# Patient Record
Sex: Male | Born: 1950 | Race: White | Hispanic: No | Marital: Married | Smoking: Current every day smoker
Health system: Southern US, Community
[De-identification: ages and names within clinical notes are randomized; demographics above are authoritative.]

## PROBLEM LIST (undated history)

## (undated) DIAGNOSIS — Z5189 Encounter for other specified aftercare: Secondary | ICD-10-CM

## (undated) DIAGNOSIS — H269 Unspecified cataract: Secondary | ICD-10-CM

## (undated) DIAGNOSIS — R011 Cardiac murmur, unspecified: Secondary | ICD-10-CM

## (undated) DIAGNOSIS — E079 Disorder of thyroid, unspecified: Secondary | ICD-10-CM

## (undated) DIAGNOSIS — M1990 Unspecified osteoarthritis, unspecified site: Secondary | ICD-10-CM

## (undated) DIAGNOSIS — J449 Chronic obstructive pulmonary disease, unspecified: Secondary | ICD-10-CM

## (undated) DIAGNOSIS — D509 Iron deficiency anemia, unspecified: Secondary | ICD-10-CM

## (undated) DIAGNOSIS — E785 Hyperlipidemia, unspecified: Secondary | ICD-10-CM

## (undated) DIAGNOSIS — E039 Hypothyroidism, unspecified: Secondary | ICD-10-CM

## (undated) HISTORY — DX: Unspecified cataract: H26.9

## (undated) HISTORY — DX: Disorder of thyroid, unspecified: E07.9

## (undated) HISTORY — DX: Cardiac murmur, unspecified: R01.1

## (undated) HISTORY — DX: Hyperlipidemia, unspecified: E78.5

## (undated) HISTORY — DX: Iron deficiency anemia, unspecified: D50.9

## (undated) HISTORY — PX: HERNIA REPAIR: SHX51

## (undated) HISTORY — DX: Unspecified osteoarthritis, unspecified site: M19.90

## (undated) HISTORY — DX: Hypothyroidism, unspecified: E03.9

## (undated) HISTORY — DX: Chronic obstructive pulmonary disease, unspecified: J44.9

## (undated) HISTORY — DX: Encounter for other specified aftercare: Z51.89

## (undated) HISTORY — PX: COLONOSCOPY: SHX174

---

## 2003-08-26 DIAGNOSIS — D229 Melanocytic nevi, unspecified: Secondary | ICD-10-CM

## 2003-08-26 HISTORY — DX: Melanocytic nevi, unspecified: D22.9

## 2009-09-14 ENCOUNTER — Ambulatory Visit: Payer: Self-pay | Admitting: Internal Medicine

## 2009-09-24 ENCOUNTER — Encounter: Payer: Self-pay | Admitting: Internal Medicine

## 2009-09-24 ENCOUNTER — Ambulatory Visit: Payer: Self-pay | Admitting: Internal Medicine

## 2009-09-29 ENCOUNTER — Encounter: Payer: Self-pay | Admitting: Internal Medicine

## 2012-07-31 ENCOUNTER — Encounter: Payer: Self-pay | Admitting: Internal Medicine

## 2013-01-01 ENCOUNTER — Other Ambulatory Visit: Payer: Self-pay | Admitting: Endocrinology

## 2013-01-01 DIAGNOSIS — E0590 Thyrotoxicosis, unspecified without thyrotoxic crisis or storm: Secondary | ICD-10-CM

## 2013-01-17 ENCOUNTER — Encounter (HOSPITAL_COMMUNITY)
Admission: RE | Admit: 2013-01-17 | Discharge: 2013-01-17 | Disposition: A | Discharge status: Home / Self Care | Payer: BC Managed Care – PPO | Source: Ambulatory Visit | Attending: Endocrinology | Admitting: Endocrinology

## 2013-01-17 DIAGNOSIS — E0590 Thyrotoxicosis, unspecified without thyrotoxic crisis or storm: Secondary | ICD-10-CM

## 2013-01-18 ENCOUNTER — Encounter (HOSPITAL_COMMUNITY)
Admission: RE | Admit: 2013-01-18 | Discharge: 2013-01-18 | Disposition: A | Discharge status: Home / Self Care | Payer: BC Managed Care – PPO | Source: Ambulatory Visit | Attending: Endocrinology | Admitting: Endocrinology

## 2013-01-18 MED ORDER — SODIUM PERTECHNETATE TC 99M INJECTION: 10.8000 | Freq: Once | INTRAVENOUS | Status: AC | PRN

## 2013-01-18 MED ORDER — SODIUM IODIDE I 131 CAPSULE: 9.6000 | Freq: Once | INTRAVENOUS | Status: AC | PRN

## 2013-03-27 ENCOUNTER — Ambulatory Visit
Admission: RE | Admit: 2013-03-27 | Discharge: 2013-03-27 | Disposition: A | Discharge status: Home / Self Care | Payer: BC Managed Care – PPO | Source: Ambulatory Visit | Attending: Family Medicine | Admitting: Family Medicine

## 2013-03-27 ENCOUNTER — Other Ambulatory Visit: Payer: Self-pay | Admitting: Family Medicine

## 2013-03-27 DIAGNOSIS — R599 Enlarged lymph nodes, unspecified: Secondary | ICD-10-CM

## 2013-03-28 ENCOUNTER — Other Ambulatory Visit: Payer: Self-pay | Admitting: Family Medicine

## 2013-03-28 ENCOUNTER — Telehealth: Payer: Self-pay | Admitting: Radiology

## 2013-03-28 DIAGNOSIS — E041 Nontoxic single thyroid nodule: Secondary | ICD-10-CM

## 2013-03-28 NOTE — Telephone Encounter (Signed)
ERROR.  INCORRECT CHART  Above message entered in incorrect chart  Amado Nash, RN

## 2013-03-28 NOTE — Telephone Encounter (Deleted)
Left message on daughter's (POA) mobile phone.  Need to schedule 2 yr f/u Surveillance of Left Renal Mass w/ Dr Fredia Sorrow.

## 2013-04-02 ENCOUNTER — Other Ambulatory Visit (HOSPITAL_COMMUNITY)
Admission: RE | Admit: 2013-04-02 | Discharge: 2013-04-02 | Disposition: A | Discharge status: Home / Self Care | Payer: BC Managed Care – PPO | Source: Ambulatory Visit | Attending: Interventional Radiology | Admitting: Interventional Radiology

## 2013-04-02 ENCOUNTER — Ambulatory Visit
Admission: RE | Admit: 2013-04-02 | Discharge: 2013-04-02 | Disposition: A | Discharge status: Home / Self Care | Payer: BC Managed Care – PPO | Source: Ambulatory Visit | Attending: Family Medicine | Admitting: Family Medicine

## 2013-04-02 DIAGNOSIS — E041 Nontoxic single thyroid nodule: Secondary | ICD-10-CM

## 2013-04-02 DIAGNOSIS — E049 Nontoxic goiter, unspecified: Secondary | ICD-10-CM | POA: Insufficient documentation

## 2013-07-23 ENCOUNTER — Other Ambulatory Visit: Payer: Self-pay | Admitting: *Deleted

## 2013-07-23 ENCOUNTER — Other Ambulatory Visit (INDEPENDENT_AMBULATORY_CARE_PROVIDER_SITE_OTHER): Payer: BC Managed Care – PPO

## 2013-07-23 DIAGNOSIS — E039 Hypothyroidism, unspecified: Secondary | ICD-10-CM

## 2013-07-23 DIAGNOSIS — E0590 Thyrotoxicosis, unspecified without thyrotoxic crisis or storm: Secondary | ICD-10-CM | POA: Insufficient documentation

## 2013-07-23 LAB — COMPREHENSIVE METABOLIC PANEL
ALT: 14 U/L (ref 0–53)
BUN: 17 mg/dL (ref 6–23)
CO2: 28 mEq/L (ref 19–32)
Calcium: 9.3 mg/dL (ref 8.4–10.5)
Chloride: 102 mEq/L (ref 96–112)
Creatinine, Ser: 1 mg/dL (ref 0.4–1.5)
GFR: 79.23 mL/min (ref >60.00)

## 2013-07-23 LAB — T4, FREE: Free T4: 0.62 ng/dL (ref 0.60–1.60)

## 2013-07-25 ENCOUNTER — Ambulatory Visit (INDEPENDENT_AMBULATORY_CARE_PROVIDER_SITE_OTHER): Payer: BC Managed Care – PPO | Admitting: Endocrinology

## 2013-07-25 ENCOUNTER — Encounter: Payer: Self-pay | Admitting: Endocrinology

## 2013-07-25 VITALS — BP 118/80 | HR 60 | Temp 98.5°F | Resp 12 | Ht 70.0 in | Wt 175.0 lb

## 2013-07-25 DIAGNOSIS — E0590 Thyrotoxicosis, unspecified without thyrotoxic crisis or storm: Secondary | ICD-10-CM

## 2013-07-25 NOTE — Progress Notes (Signed)
Patient ID: Terry Miranda, male   DOB: 08/14/1951, 62 y.o.   MRN: [id number]  Reason for Appointment:  Hyperthyroidism, new visit    History of Present Illness:   The Hyperthyroidism was first diagnosed in 11/2012 Initial symptoms at diagnosis was shakiness, some fatigue and feeling warm. Also had lost 20 pounds  Treatment was started in 1/14 with 5 mg methimazole twice a day. He had good results with this and the medication was tapered off and stopped in 5/14. 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 Do not feel unusually cold or lethargic         Appointment on 07/23/2013  Component Date Value Range Status  . TSH 07/23/2013 0.91  0.35 - 5.50 uIU/mL Final  . Free T4 07/23/2013 0.62  0.60 - 1.60 ng/dL Final  . Sodium 40/98/1191 138  135 - 145 mEq/L Final  . Potassium 07/23/2013 4.2  3.5 - 5.1 mEq/L Final  . Chloride 07/23/2013 102  96 - 112 mEq/L Final  . CO2 07/23/2013 28  19 - 32 mEq/L Final  . Glucose, Bld 07/23/2013 80  70 - 99 mg/dL Final  . BUN 47/82/9562 17  6 - 23 mg/dL Final  . Creatinine, Ser 07/23/2013 1.0  0.4 - 1.5 mg/dL Final  . Total Bilirubin 07/23/2013 0.5  0.3 - 1.2 mg/dL Final  . Alkaline Phosphatase 07/23/2013 87  39 - 117 U/L Final  . AST 07/23/2013 16  0 - 37 U/L Final  . ALT 07/23/2013 14  0 - 53 U/L Final  . Total Protein 07/23/2013 7.3  6.0 - 8.3 g/dL Final  . Albumin 13/07/6577 3.8  3.5 - 5.2 g/dL Final  . Calcium 46/96/2952 9.3  8.4 - 10.5 mg/dL Final  . GFR 84/13/2440 79.23  >60.00 mL/min Final      Medication List       This list is accurate as of: 07/25/13  3:09 PM.  Always use your most recent med list.               cholecalciferol 1000 UNITS tablet   Commonly known as:  VITAMIN D  Take 2,000 Units by mouth daily. Two tablets once a day     methimazole 5 MG tablet  Commonly known as:  TAPAZOLE  5 mg daily.     tamsulosin 0.4 MG Caps capsule  Commonly known as:  FLOMAX  0.4 mg.            No past medical history on file.  No past surgical history on file.  No family history on file.  Social History:  reports that he has been smoking Cigarettes.  He has been smoking about 1.00 pack per day. He has never used smokeless tobacco. His alcohol and drug histories are not on file.  Allergies:  Allergies  Allergen Reactions  . Amoxicillin     REACTION: face swelled    Review of Systems:  CARDIOLOGY: no history of high blood pressure.       GASTROENTEROLOGY:  no  nausea  ENDOCRINOLOGY:  no history of Diabetes.    Has history of BPH  Has previous history of hypercholesterolemia  Examination:   BP 118/80  Pulse 60  Temp(Src) 98.5 F (36.9 C)  Resp 12  Ht 5\' 10"  (1.778 m)  Wt 175 lb (79.379 kg)  BMI 25.11 kg/m2  SpO2 96%   General Appearance: pleasant, not anxious or hyperkinetic..          Eyes: No proptosis, eyes look normal externally.          Neck: The thyroid is enlarged 1-1/2 times normal, smooth, non-tender and mostly on the right side.       Extremities: hands are normal to touch, not diaphoretic Neurological: REFLEXES: at biceps are normal.         TREMORS:  none    Assessments 1. Hyperthyroidism - 242.90  He has had mild hyperthyroidism treated with antithyroid drugs. He did have recurrence when his medication was stopped in  5/14 Hyperthyroidism has improved again with low-dose methimazole, was having only minimal symptoms on his last visit in June No shakiness at this time He is tolerating the methimazole well and has had good response His free T4 is low normal and TSH is back to normal indicating incipient hypothyroidism   Treatment:  Reduce the dose to half a tablet and probably continue  this for least 2 months, he will call if he is having any recurrence of hypothyroid symptoms or unusual fatigue    Terry Miranda 07/25/2013, 3:09 PM

## 2013-07-25 NOTE — Patient Instructions (Addendum)
HALF TAB DAILY

## 2013-07-31 ENCOUNTER — Other Ambulatory Visit: Payer: Self-pay | Admitting: *Deleted

## 2013-07-31 MED ORDER — METHIMAZOLE 5 MG PO TABS: 5.0000 mg | ORAL_TABLET | Freq: Every day | ORAL | Status: DC

## 2013-08-08 ENCOUNTER — Telehealth: Payer: Self-pay | Admitting: Endocrinology

## 2013-08-08 MED ORDER — METHIMAZOLE 5 MG PO TABS: 5.0000 mg | ORAL_TABLET | Freq: Every day | ORAL | Status: DC

## 2013-08-08 NOTE — Telephone Encounter (Signed)
Med - Thyroid med. Normal pharmacy will not refill, must use [facility] (3 month supply) / Sherri S.

## 2013-08-08 NOTE — Telephone Encounter (Signed)
rx sent to [facility]

## 2013-09-20 ENCOUNTER — Other Ambulatory Visit (INDEPENDENT_AMBULATORY_CARE_PROVIDER_SITE_OTHER): Payer: BC Managed Care – PPO

## 2013-09-20 DIAGNOSIS — E0590 Thyrotoxicosis, unspecified without thyrotoxic crisis or storm: Secondary | ICD-10-CM

## 2013-09-23 ENCOUNTER — Other Ambulatory Visit: Payer: BC Managed Care – PPO

## 2013-09-25 ENCOUNTER — Ambulatory Visit (INDEPENDENT_AMBULATORY_CARE_PROVIDER_SITE_OTHER): Payer: BC Managed Care – PPO | Admitting: Endocrinology

## 2013-09-25 ENCOUNTER — Encounter: Payer: Self-pay | Admitting: Endocrinology

## 2013-09-25 VITALS — BP 126/80 | HR 57 | Temp 98.3°F | Resp 12 | Ht 70.0 in | Wt 178.4 lb

## 2013-09-25 DIAGNOSIS — E0590 Thyrotoxicosis, unspecified without thyrotoxic crisis or storm: Secondary | ICD-10-CM

## 2013-09-25 NOTE — Patient Instructions (Signed)
1/2 tab three times a week for 4 weeks ( 11/15)  Then stop till next visit

## 2013-09-25 NOTE — Progress Notes (Signed)
Patient ID: Terry Miranda, male   DOB: 11-01-1951, 62 y.o.   MRN: [id number]  Reason for Appointment:  Hyperthyroidism, followup visit    History of Present Illness:    The Hyperthyroidism was first diagnosed in 11/2012 Initial symptoms at diagnosis were shakiness, some fatigue and feeling warm. Also had lost 20 pounds  Treatment was started in 12/2012 with 5 mg methimazole twice a day. He had good results with this and the medication was tapered off and stopped in 5/14. 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       TSH in August was 0.91 and free T4 was 0.62  Appointment on 09/20/2013  Component Date Value Range Status  . Free T4 09/20/2013 0.67  0.60 - 1.60 ng/dL Final  . TSH 40/98/1191 0.54  0.35 - 5.50 uIU/mL Final      Medication List       This list is accurate as of: 09/25/13  4:52 PM.  Always use your most recent med list.               cholecalciferol 1000 UNITS tablet  Commonly known as:  VITAMIN D  Take 2,000 Units by mouth daily. Two tablets once a day     methimazole 5 MG tablet  Commonly known as:  TAPAZOLE  Take 5 mg by mouth daily. 1/2 tablet daily     tamsulosin 0.4 MG Caps capsule  Commonly known as:  FLOMAX  0.4 mg.            No past medical history on file.  No past surgical history on file.  No family history on file.  Social History:  reports that he has been smoking Cigarettes.  He has been smoking about 1.00 pack per day. He has never used smokeless tobacco. His alcohol and drug histories are not on file.  Allergies:  Allergies  Allergen Reactions  . Amoxicillin     REACTION: face  swelled    Review of Systems:  CARDIOLOGY: no history of high blood pressure.       ENDOCRINOLOGY:  no history of Diabetes.    Has history of BPH  Has previous history of hypercholesterolemia           Examination:   BP 126/80  Pulse 57  Temp(Src) 98.3 F (36.8 C)  Resp 12  Ht 5\' 10"  (1.778 m)  Wt 178 lb 6.4 oz (80.922 kg)  BMI 25.6 kg/m2  SpO2 97%   General Appearance: pleasant, not anxious or hyperkinetic..          Eyes: No proptosis, eyes look normal externally.          Neck: The thyroid is enlarged 1-1/2 times normal, smooth, not nodular and mostly on the right side.       Extremities: hands are normal to touch Neurological: REFLEXES: at biceps are normal.         TREMORS:  none    Assessments 1. Hyperthyroidism - 242.90  He has had mild hyperthyroidism treated with antithyroid drugs. He did have recurrence when his medication was stopped in  5/14 Hyperthyroidism is  stable again with low-dose methimazole, subjectively having no symptoms Still has a small goiter He is only on low-dose methimazole with 2.5 mg His free T4 is relatively low normal and TSH is still normal indicating probable remission   Treatment:  Reduce the dose to half a tablet 3 times a week for the next 4 weeks and then stop He will call if any symptoms of hyperthyroidism recurr but otherwise followup in 2 months   Liban Guedes 09/25/2013, 4:52 PM

## 2013-12-02 ENCOUNTER — Other Ambulatory Visit (INDEPENDENT_AMBULATORY_CARE_PROVIDER_SITE_OTHER): Payer: BC Managed Care – PPO

## 2013-12-02 DIAGNOSIS — E0590 Thyrotoxicosis, unspecified without thyrotoxic crisis or storm: Secondary | ICD-10-CM

## 2013-12-02 LAB — TSH: TSH: 0.02 u[IU]/mL — ABNORMAL LOW (ref 0.35–5.50)

## 2013-12-03 ENCOUNTER — Ambulatory Visit: Payer: BC Managed Care – PPO

## 2013-12-03 DIAGNOSIS — E039 Hypothyroidism, unspecified: Secondary | ICD-10-CM

## 2013-12-04 ENCOUNTER — Ambulatory Visit (INDEPENDENT_AMBULATORY_CARE_PROVIDER_SITE_OTHER): Payer: BC Managed Care – PPO | Admitting: Endocrinology

## 2013-12-04 ENCOUNTER — Encounter: Payer: Self-pay | Admitting: Endocrinology

## 2013-12-04 VITALS — BP 118/70 | HR 56 | Temp 98.3°F | Resp 12 | Ht 70.0 in | Wt 181.3 lb

## 2013-12-04 DIAGNOSIS — E0590 Thyrotoxicosis, unspecified without thyrotoxic crisis or storm: Secondary | ICD-10-CM

## 2013-12-04 NOTE — Progress Notes (Signed)
Patient ID: Terry Miranda, male   DOB: 08-Dec-1951, 62 y.o.   MRN: [id number]  Reason for Appointment:  Hyperthyroidism, followup visit    History of Present Illness:   Hyperthyroidism was first diagnosed in 11/2012 Initial symptoms at diagnosis were shakiness, some fatigue and feeling warm. Also had lost 20 pounds  Treatment was started in 12/2012 with 5 mg methimazole twice a day. He had good results with this and the medication was tapered off and stopped in 5/14. 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 Also having mild fatigue but no cord intolerance or palpitations He  has gained a little weight since his last visit.       LABS:  Clinical Support on 12/03/2013  Component Date Value Range Status  . T3, Free 12/03/2013 4.4* 2.3 - 4.2 pg/mL Final  Appointment on 12/02/2013  Component Date Value Range Status  . TSH 12/02/2013 0.02* 0.35 - 5.50 uIU/mL Final  . Free T4 12/02/2013 1.17  0.60 - 1.60 ng/dL Final   Lab Results  Component Value Date   FREET4 1.17 12/02/2013   FREET4 0.67 09/20/2013   FREET4 0.62 07/23/2013   TSH 0.02* 12/02/2013   TSH 0.54 09/20/2013   TSH 0.91 07/23/2013      Medication List       This list is accurate as of: 12/04/13  8:25 AM.  Always use your most recent med list.               cholecalciferol 1000 UNITS tablet  Commonly known as:  VITAMIN D  Take 2,000 Units by mouth daily. Two tablets once a day     methimazole 5 MG tablet  Commonly known as:  TAPAZOLE  Take 5 mg by mouth daily. 1/2 tablet daily     tamsulosin 0.4 MG Caps capsule  Commonly  known as:  FLOMAX  0.4 mg.            No past medical history on file.  No past surgical history on file.  No family history on file.  Social History:  reports that he has been smoking Cigarettes.  He has been smoking about 1.00 pack per day. He has never used smokeless tobacco. His alcohol and drug histories are not on file.  Allergies:  Allergies  Allergen Reactions  . Amoxicillin     REACTION: face swelled    Review of Systems:  CARDIOLOGY: no history of high blood pressure.       He has no history of Diabetes.    Has history of BPH  Has previous history of hypercholesterolemia           Examination:   BP 118/70  Pulse 56  Temp(Src) 98.3 F (36.8 C)  Resp 12  Ht 5\' 10"  (1.778 m)  Wt 181 lb 4.8 oz (82.237 kg)  BMI 26.01 kg/m2  SpO2 98%   General Appearance: pleasant, not anxious          Eyes: No proptosis, minimal puffiness of eyelids  Neck: The thyroid is enlarged 1-1/2-2 times normal, smooth, not nodular on the right side.       Extremities:  skin is normal Neurological: REFLEXES: at biceps are normal.   TREMORS:  none    Assessment:. 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  He again appears to have had a relapse after his medication was stopped in 11/14 even though it was tapered off very slowly and his free T4 was low normal on the last visit Currently he has a suppressed TSH and a high free T3 level  Still has a  right-sided goiterWhich appears to be relatively larger  Discussed needing I-131 since he has relapsed even with one year duration of treatment with antithyroid drugs He is still reluctant to consider this and wants to have more information on I-131. This was provided He looked this over and call in one month with his decision    Treatment:    For now will restart methimazole 5 mg daily then discuss treatment options in about a month     Sharlyne Koeneman 12/04/2013, 8:25 AM

## 2014-01-14 ENCOUNTER — Other Ambulatory Visit: Payer: BC Managed Care – PPO

## 2014-01-14 ENCOUNTER — Other Ambulatory Visit: Payer: Self-pay | Admitting: Endocrinology

## 2014-01-16 ENCOUNTER — Other Ambulatory Visit (INDEPENDENT_AMBULATORY_CARE_PROVIDER_SITE_OTHER): Payer: BC Managed Care – PPO

## 2014-01-16 DIAGNOSIS — E0590 Thyrotoxicosis, unspecified without thyrotoxic crisis or storm: Secondary | ICD-10-CM

## 2014-01-16 LAB — T4, FREE: Free T4: 0.68 ng/dL (ref 0.60–1.60)

## 2014-01-16 LAB — T3, FREE: T3 FREE: 3.1 pg/mL (ref 2.3–4.2)

## 2014-01-16 LAB — TSH: TSH: 0.77 u[IU]/mL (ref 0.35–5.50)

## 2014-01-22 ENCOUNTER — Encounter: Payer: Self-pay | Admitting: Endocrinology

## 2014-01-22 ENCOUNTER — Ambulatory Visit (INDEPENDENT_AMBULATORY_CARE_PROVIDER_SITE_OTHER): Payer: BC Managed Care – PPO | Admitting: Endocrinology

## 2014-01-22 VITALS — BP 110/68 | HR 73 | Temp 97.9°F | Resp 14 | Ht 70.0 in | Wt 180.4 lb

## 2014-01-22 DIAGNOSIS — E0590 Thyrotoxicosis, unspecified without thyrotoxic crisis or storm: Secondary | ICD-10-CM

## 2014-01-22 NOTE — Patient Instructions (Signed)
Monday before test stop Methimazole  Now take 1/2 daily

## 2014-01-22 NOTE — Progress Notes (Signed)
Patient ID: Terry Miranda, male   DOB: 1951/02/28, 63 y.o.   MRN: [id number]    Reason for Appointment:  Hyperthyroidism, followup visit   History of Present Illness:   Hyperthyroidism was first diagnosed in 11/2012 Initial symptoms at diagnosis were shakiness, some fatigue and feeling warm. Also had lost 20 pounds  Treatment was started in 12/2012 with 5 mg methimazole twice a day. He had good results with this and the medication was tapered off and stopped in 5/14. 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 Subsequently with stopping his medication in 11/14 he relapsed with his hyperthyroidism and was restarted on methimazole in 12/14  Since his last visit his mild symptoms of shakiness are better and has no unusual heat intolerance but may feel a little cold at times He is taking is 5 mg methimazole quite regularly       Wt Readings from Last 3 Encounters:  01/22/14 180 lb 6.4 oz (81.829 kg)  12/04/13 181 lb 4.8 oz (82.237 kg)  09/25/13 178 lb 6.4 oz (80.922 kg)   LABS:  Appointment on 01/16/2014  Component Date Value Ref Range Status  . TSH 01/16/2014 0.77  0.35 - 5.50 uIU/mL Final  . Free T4 01/16/2014 0.68  0.60 - 1.60 ng/dL Final  . T3, Free 01/16/2014 3.1  2.3 - 4.2 pg/mL Final   Lab Results  Component Value Date   FREET4 0.68 01/16/2014   FREET4 1.17 12/02/2013   FREET4 0.67 09/20/2013   FREET4 0.62 07/23/2013   TSH 0.77 01/16/2014   TSH 0.02* 12/02/2013   TSH 0.54 09/20/2013   TSH 0.91 07/23/2013      Medication List       This list is accurate as of: 01/22/14  4:10 PM.  Always use your most recent med list.               cholecalciferol 1000 UNITS tablet  Commonly known as:  VITAMIN D   Take 2,000 Units by mouth daily. Two tablets once a day     methimazole 5 MG tablet  Commonly known as:  TAPAZOLE  Take 5 mg by mouth daily.     methimazole 5 MG tablet  Commonly known as:  TAPAZOLE  TAKE 1 TABLET DAILY     tamsulosin 0.4 MG Caps capsule  Commonly known as:  FLOMAX  0.4 mg.            No past medical history on file.  No past surgical history on file.  No family history on file.  Social History:  reports that he has been smoking Cigarettes.  He has been smoking about 1.00 pack per day. He has never used smokeless tobacco. His alcohol and drug histories are not on file.  Allergies:  Allergies  Allergen Reactions  . Amoxicillin     REACTION: face swelled    Review of Systems:  CARDIOLOGY: no history of high blood pressure.       He has no history of Diabetes.    Has history of BPH  Has previous history of hypercholesterolemia           Examination:   BP 110/68  Pulse 73  Temp(Src) 97.9 F (36.6 C)  Resp 14  Ht 5\' 10"  (1.778 m)  Wt 180 lb 6.4 oz (81.829 kg)  BMI 25.88 kg/m2  SpO2 97%   General Appearance: pleasant, not anxious          Eyes: No proptosis, minimal puffiness of eyelids        Neck: The thyroid is enlarged 1-1/2-2 times normal, smooth, mostly on the right side.       Extremities:  skin is normal Neurological: REFLEXES: at biceps are normal.   TREMORS:  none    Assessment/Plan:. 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 He has not been able to get into remission with long-term treatment, even though he was requiring low doses of methimazole  Currently is well-controlled with 5 mg methimazole  Still has a  right-sided goiter  Discussed that I-131 would be the preferred treatment to achieve control and he agrees to do this now He wants to do this next month when he is on vacation Meanwhile he will take 2.5 mg  methimazole   Stephane Niemann 01/22/2014, 4:10 PM

## 2014-02-10 ENCOUNTER — Ambulatory Visit (HOSPITAL_COMMUNITY): Payer: BC Managed Care – PPO

## 2014-02-11 ENCOUNTER — Encounter (HOSPITAL_COMMUNITY): Payer: BC Managed Care – PPO

## 2014-02-24 ENCOUNTER — Ambulatory Visit (HOSPITAL_COMMUNITY): Payer: BC Managed Care – PPO

## 2014-02-24 ENCOUNTER — Encounter (HOSPITAL_COMMUNITY)
Admission: RE | Admit: 2014-02-24 | Discharge: 2014-02-24 | Disposition: A | Discharge status: Home / Self Care | Payer: BC Managed Care – PPO | Source: Ambulatory Visit | Attending: Endocrinology | Admitting: Endocrinology

## 2014-02-24 DIAGNOSIS — E0590 Thyrotoxicosis, unspecified without thyrotoxic crisis or storm: Secondary | ICD-10-CM | POA: Insufficient documentation

## 2014-02-25 ENCOUNTER — Encounter (HOSPITAL_COMMUNITY)
Admission: RE | Admit: 2014-02-25 | Discharge: 2014-02-25 | Disposition: A | Discharge status: Home / Self Care | Payer: BC Managed Care – PPO | Source: Ambulatory Visit | Attending: Endocrinology | Admitting: Endocrinology

## 2014-02-25 ENCOUNTER — Encounter (HOSPITAL_COMMUNITY): Payer: BC Managed Care – PPO

## 2014-02-25 ENCOUNTER — Telehealth: Payer: Self-pay | Admitting: Endocrinology

## 2014-02-25 MED ORDER — SODIUM IODIDE I 131 CAPSULE
11.0000 | Freq: Once | INTRAVENOUS | Status: AC | PRN
  Administered 2014-02-25: 11 via ORAL

## 2014-02-25 NOTE — Telephone Encounter (Signed)
Pt would like results asap of scan and if possible have treatment this week

## 2014-02-26 ENCOUNTER — Other Ambulatory Visit: Payer: Self-pay | Admitting: Endocrinology

## 2014-02-26 DIAGNOSIS — E0590 Thyrotoxicosis, unspecified without thyrotoxic crisis or storm: Secondary | ICD-10-CM

## 2014-02-26 NOTE — Telephone Encounter (Signed)
Please see below and advise.

## 2014-02-26 NOTE — Telephone Encounter (Signed)
Please see the result note, referral done

## 2014-02-26 NOTE — Telephone Encounter (Signed)
I spoke with patient, gave him his results.

## 2014-02-26 NOTE — Progress Notes (Signed)
Quick Note:  He will be scheduled for the radioactive iodine treatment, his uptake test is mildly increased as expected, to stop methimazole completely from now on, should have followup in 4-6 weeks with labs ______

## 2014-03-20 ENCOUNTER — Ambulatory Visit (HOSPITAL_COMMUNITY): Payer: BC Managed Care – PPO

## 2014-03-20 ENCOUNTER — Encounter (HOSPITAL_COMMUNITY)
Admission: RE | Admit: 2014-03-20 | Discharge: 2014-03-20 | Disposition: A | Discharge status: Home / Self Care | Payer: BC Managed Care – PPO | Source: Ambulatory Visit | Attending: Endocrinology | Admitting: Endocrinology

## 2014-03-20 DIAGNOSIS — E0590 Thyrotoxicosis, unspecified without thyrotoxic crisis or storm: Secondary | ICD-10-CM | POA: Insufficient documentation

## 2014-03-20 MED ORDER — SODIUM IODIDE I 131 CAPSULE: 15.9000 | Freq: Once | INTRAVENOUS | Status: AC | PRN

## 2014-03-21 ENCOUNTER — Other Ambulatory Visit: Payer: BC Managed Care – PPO

## 2014-03-26 ENCOUNTER — Ambulatory Visit: Payer: BC Managed Care – PPO | Admitting: Endocrinology

## 2014-04-09 ENCOUNTER — Other Ambulatory Visit (INDEPENDENT_AMBULATORY_CARE_PROVIDER_SITE_OTHER): Payer: BC Managed Care – PPO

## 2014-04-09 DIAGNOSIS — E0590 Thyrotoxicosis, unspecified without thyrotoxic crisis or storm: Secondary | ICD-10-CM

## 2014-04-09 LAB — T4, FREE: Free T4: 0.55 ng/dL — ABNORMAL LOW (ref 0.60–1.60)

## 2014-04-09 LAB — TSH: TSH: 1.76 u[IU]/mL (ref 0.35–5.50)

## 2014-04-14 ENCOUNTER — Ambulatory Visit: Payer: BC Managed Care – PPO | Admitting: Endocrinology

## 2014-04-24 ENCOUNTER — Ambulatory Visit (INDEPENDENT_AMBULATORY_CARE_PROVIDER_SITE_OTHER): Payer: BC Managed Care – PPO | Admitting: Endocrinology

## 2014-04-24 ENCOUNTER — Encounter: Payer: Self-pay | Admitting: Endocrinology

## 2014-04-24 VITALS — BP 132/70 | HR 51 | Temp 98.0°F | Resp 14 | Ht 70.0 in | Wt 185.8 lb

## 2014-04-24 DIAGNOSIS — E890 Postprocedural hypothyroidism: Secondary | ICD-10-CM

## 2014-04-24 MED ORDER — LEVOTHYROXINE SODIUM 112 MCG PO TABS: 112.0000 ug | ORAL_TABLET | Freq: Every day | ORAL | Status: DC

## 2014-04-24 NOTE — Progress Notes (Signed)
Patient ID: Terry Miranda, male   DOB: Aug 05, 1951, 63 y.o.   MRN: [id number]    Reason for Appointment:  Hyperthyroidism, followup visit   History of Present Illness:   Hyperthyroidism was first diagnosed in 11/2012 Initial symptoms at diagnosis were shakiness, some fatigue and feeling warm. Also had lost 20 pounds  Treatment in 12/2012 was with 5 mg methimazole twice a day. He had good results with this and the medication was tapered off and stopped in 5/14. 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 Subsequently with stopping his medication in 11/14 he relapsed with his hyperthyroidism and was restarted on methimazole in 12/14 Since he was able to wean off his methimazole again he was sent for I-131 treatment He had this on 03/20/14 with 15.9 mCi and methimazole was stopped a week before this Since his last visit he has noticed some cold sensitivity but no unusual fatigue. Has gained some weight Also asking about some soreness in the neck area on swallowing which gradually started before his I-131 treatment       Wt Readings from Last 3 Encounters:  04/24/14 185 lb 12.8 oz (84.278 kg)  01/22/14 180 lb 6.4 oz (81.829 kg)  12/04/13 181 lb 4.8 oz (82.237 kg)   LABS:  Lab Results  Component Value Date   FREET4 0.55* 04/09/2014   FREET4 0.68 01/16/2014   FREET4 1.17 12/02/2013   FREET4 0.67 09/20/2013   TSH 1.76 04/09/2014   TSH 0.77 01/16/2014   TSH 0.02* 12/02/2013   TSH 0.54 09/20/2013      Medication List       This list is accurate as of: 04/24/14  8:09 AM.  Always use your most recent med list.               cholecalciferol 1000 UNITS tablet  Commonly known as:  VITAMIN D  Take 2,000 Units by mouth  daily. Two tablets once a day     methimazole 5 MG tablet  Commonly known as:  TAPAZOLE  Take 5 mg by mouth daily.     methimazole 5 MG tablet  Commonly known as:  TAPAZOLE  TAKE 1 TABLET DAILY     tamsulosin 0.4 MG Caps capsule  Commonly known as:  FLOMAX  0.4 mg.            No past medical history on file.  No past surgical history on file.  No family history on file.  Social History:  reports that he has been smoking Cigarettes.  He has been smoking about 1.00 pack per day. He has never used smokeless tobacco. His alcohol and drug histories are not on file.  Allergies:  Allergies  Allergen Reactions  . Amoxicillin     REACTION: face swelled    Review of Systems:  CARDIOLOGY: no history of high blood pressure.       He has no history of Diabetes.    Has history of BPH  Has previous history of hypercholesterolemia           Examination:   BP 132/70  Pulse 51  Temp(Src) 98 F (36.7 C)  Resp 14  Ht 5\' 10"  (1.778 m)  Wt 185 lb 12.8 oz (84.278 kg)  BMI 26.66 kg/m2  SpO2 98%   General Appearance:  looks well        Eyes: No proptosis, minimal puffiness of eyelids        Neck: The thyroid is enlarged 1-1/2 times normal, smooth, only on the right side, nontender.       Extremities:  skin is normal Neurological: REFLEXES: at biceps are normal.       Assessment/Plan:. 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

## 2014-06-04 ENCOUNTER — Other Ambulatory Visit (INDEPENDENT_AMBULATORY_CARE_PROVIDER_SITE_OTHER): Payer: BC Managed Care – PPO

## 2014-06-04 DIAGNOSIS — E890 Postprocedural hypothyroidism: Secondary | ICD-10-CM

## 2014-06-04 LAB — TSH: TSH: 0.02 u[IU]/mL — ABNORMAL LOW (ref 0.35–4.50)

## 2014-06-04 LAB — T4, FREE: FREE T4: 1.25 ng/dL (ref 0.60–1.60)

## 2014-06-12 ENCOUNTER — Other Ambulatory Visit: Payer: BC Managed Care – PPO

## 2014-06-18 ENCOUNTER — Ambulatory Visit (INDEPENDENT_AMBULATORY_CARE_PROVIDER_SITE_OTHER): Payer: BC Managed Care – PPO | Admitting: Endocrinology

## 2014-06-18 ENCOUNTER — Encounter: Payer: Self-pay | Admitting: Endocrinology

## 2014-06-18 VITALS — BP 110/63 | HR 49 | Temp 98.0°F | Resp 14 | Ht 70.0 in | Wt 187.4 lb

## 2014-06-18 DIAGNOSIS — I498 Other specified cardiac arrhythmias: Secondary | ICD-10-CM

## 2014-06-18 DIAGNOSIS — E890 Postprocedural hypothyroidism: Secondary | ICD-10-CM

## 2014-06-18 DIAGNOSIS — R001 Bradycardia, unspecified: Secondary | ICD-10-CM

## 2014-06-18 MED ORDER — LEVOTHYROXINE SODIUM 88 MCG PO TABS: 88.0000 ug | ORAL_TABLET | Freq: Every day | ORAL | Status: DC

## 2014-06-18 NOTE — Progress Notes (Signed)
Patient ID: Terry Miranda, male   DOB: May 20, 1951, 63 y.o.   MRN: [id number]    Reason for Appointment:  Hyperthyroidism, followup visit   History of Present Illness:   Hyperthyroidism was first diagnosed in 11/2012 Initial symptoms at diagnosis were shakiness, some fatigue and feeling warm. Also had lost 20 pounds  Treatment in 12/2012 was with 5 mg methimazole twice a day. He had good results with this and the medication was tapered off and stopped in 5/14. 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 Subsequently with stopping his medication in 11/14 he relapsed with his hyperthyroidism and was restarted on methimazole in 12/14 Since he was able to wean off his methimazole again he was given 15.9 mCi of I-131 on 03/20/14  Because of developing mild hypothyroidism he was started on Synthroid 112 mcg on 04/24/14 He has not had any cold intolerance or fatigue although has gained a couple pounds; no palpitations or shakiness       Wt Readings from Last 3 Encounters:  06/18/14 187 lb 6.4 oz (85.004 kg)  04/24/14 185 lb 12.8 oz (84.278 kg)  01/22/14 180 lb 6.4 oz (81.829 kg)   LABS:  Lab Results  Component Value Date   FREET4 1.25 06/04/2014   FREET4 0.55* 04/09/2014   FREET4 0.68 01/16/2014   FREET4 1.17 12/02/2013   TSH 0.02* 06/04/2014   TSH 1.76 04/09/2014   TSH 0.77 01/16/2014   TSH 0.02* 12/02/2013      Medication List       This list is accurate as of: 06/18/14  4:14 PM.  Always use your most recent med list.               cholecalciferol 1000 UNITS tablet  Commonly known as:  VITAMIN D  Take 2,000 Units by mouth daily. Two tablets once a day     levothyroxine 112 MCG tablet  Commonly known as:   SYNTHROID, LEVOTHROID  Take 1 tablet (112 mcg total) by mouth daily.     tamsulosin 0.4 MG Caps capsule  Commonly known as:  FLOMAX  0.4 mg.            No past medical history on file.  No past surgical history on file.  No family history on file.  Social History:  reports that he has been smoking Cigarettes.  He has been smoking about 1.00 pack per day. He has never used smokeless tobacco. His alcohol and drug histories are not on file.  Allergies:  Allergies  Allergen Reactions  . Amoxicillin     REACTION: face swelled    Review of Systems:  CARDIOLOGY: no history of high blood pressure.  No history of lightheadedness, no previous history of heart murmur      He has no history of Diabetes.    Has history of BPH  Has previous history of hypercholesterolemia   No history of edema         Examination:   BP 110/63  Pulse 49  Temp(Src) 98 F (36.7 C)  Resp 14  Ht 5\' 10"  (1.778 m)  Wt 187 lb 6.4 oz (85.004 kg)  BMI 26.89 kg/m2  SpO2 98%  General Appearance:  looks well        Eyes: No proptosis, minimal puffiness of eyelids        Neck: The thyroid is just palpable on the right, smooth Heart rhythm is regular, has 2/6 ejection murmur at the base Extremities:  skin is normal No edema Neurological: REFLEXES: at biceps are normal.  no tremor.       Assessment/Plan:  Post ablative hypothyroidism:  He was started on levothyroxine 112 mcg about 6 weeks ago and although he subjectively doing well his TSH is now low again without much increase in free T4 No signs or symptoms of hyperthyroidism clinically and his right thyroid lobe is only minimally palpable  Will reduce his dose to 88 mcg and have him return in 8 weeks for followup labs.  Sinus bradycardia:  His pulse is again relatively low even though his hypothyroidism has resolved Currently asymptomatic EKG done today, this does not show any abnormal rhythm. Will forward this to  PCP  [facility] 06/18/2014, 4:14 PM

## 2014-08-08 ENCOUNTER — Encounter: Payer: Self-pay | Admitting: Internal Medicine

## 2014-08-14 ENCOUNTER — Other Ambulatory Visit (INDEPENDENT_AMBULATORY_CARE_PROVIDER_SITE_OTHER): Payer: BC Managed Care – PPO

## 2014-08-14 DIAGNOSIS — E890 Postprocedural hypothyroidism: Secondary | ICD-10-CM

## 2014-08-14 LAB — TSH: TSH: 0.4 u[IU]/mL (ref 0.35–4.50)

## 2014-08-14 LAB — T4, FREE: Free T4: 1.01 ng/dL (ref 0.60–1.60)

## 2014-08-29 ENCOUNTER — Encounter: Payer: Self-pay | Admitting: Endocrinology

## 2014-08-29 ENCOUNTER — Ambulatory Visit (INDEPENDENT_AMBULATORY_CARE_PROVIDER_SITE_OTHER): Payer: BC Managed Care – PPO | Admitting: Endocrinology

## 2014-08-29 VITALS — BP 127/66 | HR 55 | Temp 97.7°F | Resp 14 | Ht 70.0 in | Wt 188.8 lb

## 2014-08-29 DIAGNOSIS — E890 Postprocedural hypothyroidism: Secondary | ICD-10-CM

## 2014-08-29 NOTE — Patient Instructions (Signed)
1/2 pill once a week otherwise 1 daily

## 2014-08-29 NOTE — Progress Notes (Signed)
Patient ID: Terry Miranda, male   DOB: [birth year], 63 y.o.   MRN: [id number]    Reason for Appointment:  Post ablative hypothyroidism  followup visit   History of Present Illness:   Hyperthyroidism was first diagnosed in 11/2012 Initial symptoms at diagnosis were shakiness, some fatigue and feeling warm. Also had lost 20 pounds  Treatment in 12/2012 was with 5 mg methimazole twice a day. He had good results with this and the medication was tapered off and stopped in 5/14. Because of relapsing hyperthyroidism in 6/14 with minimal symptoms  he was started back on methimazole 5 mg daily Subsequently with stopping his medication in 11/14 he relapsed with his hyperthyroidism and was restarted on methimazole in 12/14 Since he was not able to wean off his methimazole again he was given 15.9 mCi of I-131 on 03/20/14  When he developed mild hypothyroidism he was started on Synthroid 112 mcg on 04/24/14 This dose was reduced in 6/15 He feels fairly well except is concerned about his weight being higher than before; his wife thinks that he is not watching his portions He has not had any fatigue       Wt Readings from Last 3 Encounters:  08/29/14 188 lb 12.8 oz (85.639 kg)  06/18/14 187 lb 6.4 oz (85.004 kg)  04/24/14 185 lb 12.8 oz (84.278 kg)   LABS:  Lab Results  Component Value Date   FREET4 1.01 08/14/2014   FREET4 1.25 06/04/2014   FREET4 0.55* 04/09/2014   FREET4 0.68 01/16/2014   TSH 0.40 08/14/2014   TSH 0.02* 06/04/2014   TSH 1.76 04/09/2014   TSH 0.77 01/16/2014      Medication List       This list is accurate as of: 08/29/14  4:24 PM.  Always use your most recent med list.               cholecalciferol 1000 UNITS tablet  Commonly known as:  VITAMIN D  Take 2,000 Units by mouth daily. Two tablets once a day     levothyroxine 88 MCG tablet  Commonly known as:  SYNTHROID, LEVOTHROID  Take 1 tablet (88 mcg total) by mouth daily.     tamsulosin 0.4 MG Caps capsule  Commonly known  as:  FLOMAX  0.4 mg.            No past medical history on file.  No past surgical history on file.  No family history on file.  Social History:  reports that he has been smoking Cigarettes.  He has been smoking about 1.00 pack per day. He has never used smokeless tobacco. His alcohol and drug histories are not on file.  Allergies:  Allergies  Allergen Reactions  . Amoxicillin     REACTION: face swelled    Review of Systems:  CARDIOLOGY: no history of high blood pressure. Has history of low pulse rate and cardiac murmur   He has no history of Diabetes.    Has history of BPH  Has previous history of hypercholesterolemia           Examination:   BP 127/66  Pulse 55  Temp(Src) 97.7 F (36.5 C)  Resp 14  Ht 5\' 10"  (1.778 m)  Wt 188 lb 12.8 oz (85.639 kg)  BMI 27.09 kg/m2  SpO2 97%   General Appearance:  looks well        Eyes: No proptosis, minimal puffiness of eyelids        Neck: The thyroid  is nonpalpable No edema Neurological: REFLEXES: at biceps are normal.  no tremor.       Assessment/Plan:  Post ablative hypothyroidism:  He is doing clinically well and his TSH is normal although only 0.4  Will reduce his dose to 6-1/2 pills per week of 88 mcg and have him return in 6 months for followup labs.    Madysen Faircloth 08/29/2014, 4:24 PM

## 2014-09-19 ENCOUNTER — Other Ambulatory Visit: Payer: Self-pay | Admitting: *Deleted

## 2014-09-19 ENCOUNTER — Telehealth: Payer: Self-pay | Admitting: *Deleted

## 2014-09-19 MED ORDER — LEVOTHYROXINE SODIUM 88 MCG PO TABS: ORAL_TABLET | ORAL | Status: DC

## 2014-09-19 NOTE — Telephone Encounter (Signed)
rx sent

## 2014-09-19 NOTE — Telephone Encounter (Signed)
Was under the impression he was going to have a RX faxed to his pharmacy for his thyroid he will be at work until 4pm then you have to try his cell #

## 2014-11-04 ENCOUNTER — Telehealth: Payer: Self-pay | Admitting: Endocrinology

## 2014-11-04 ENCOUNTER — Other Ambulatory Visit: Payer: Self-pay | Admitting: *Deleted

## 2014-11-04 MED ORDER — LEVOTHYROXINE SODIUM 88 MCG PO TABS: ORAL_TABLET | ORAL | Status: DC

## 2014-11-04 NOTE — Telephone Encounter (Signed)
rx sent

## 2014-11-04 NOTE — Telephone Encounter (Signed)
[facility]   Thank you

## 2014-11-04 NOTE — Telephone Encounter (Signed)
ok 

## 2015-01-23 ENCOUNTER — Encounter: Payer: Self-pay | Admitting: Internal Medicine

## 2015-02-09 ENCOUNTER — Ambulatory Visit (AMBULATORY_SURGERY_CENTER): Payer: Self-pay | Admitting: *Deleted

## 2015-02-09 VITALS — Ht 70.0 in | Wt 189.0 lb

## 2015-02-09 DIAGNOSIS — Z86010 Personal history of colonic polyps: Secondary | ICD-10-CM

## 2015-02-09 MED ORDER — MOVIPREP 100 G PO SOLR: ORAL | Status: DC

## 2015-02-09 NOTE — Progress Notes (Signed)
Patient denies any allergies to eggs or soy. Patient denies any problems with anesthesia/sedation. Patient denies any oxygen use at home and does not take any diet/weight loss medications. EMMI education assisgned to patient on colonoscopy, this was explained and instructions given to patient. 

## 2015-02-13 ENCOUNTER — Telehealth: Payer: Self-pay | Admitting: Internal Medicine

## 2015-02-13 NOTE — Telephone Encounter (Signed)
Standley Dakins RN called pt back, mobile number was full and unable to leave message, called work number and left message that she mailed him new instructions for miralax prep.-adm  Spoke with pt, told him we sent instructions for new prep and he could take the other back to the pharmacy, went over some instructions with him and what to buy. Pt verbalized understanding and will call if he has not received the new instructions by 02/18/15.-adm

## 2015-02-16 ENCOUNTER — Encounter: Payer: Self-pay | Admitting: Internal Medicine

## 2015-02-23 ENCOUNTER — Other Ambulatory Visit (INDEPENDENT_AMBULATORY_CARE_PROVIDER_SITE_OTHER): Payer: BLUE CROSS/BLUE SHIELD

## 2015-02-23 ENCOUNTER — Other Ambulatory Visit: Payer: BC Managed Care – PPO

## 2015-02-23 DIAGNOSIS — E890 Postprocedural hypothyroidism: Secondary | ICD-10-CM

## 2015-02-23 LAB — TSH: TSH: 3.02 u[IU]/mL (ref 0.35–4.50)

## 2015-02-23 LAB — T4, FREE: Free T4: 0.85 ng/dL (ref 0.60–1.60)

## 2015-02-24 ENCOUNTER — Encounter: Payer: Self-pay | Admitting: Internal Medicine

## 2015-02-24 ENCOUNTER — Ambulatory Visit (AMBULATORY_SURGERY_CENTER): Payer: BLUE CROSS/BLUE SHIELD | Admitting: Internal Medicine

## 2015-02-24 VITALS — BP 118/46 | HR 46 | Temp 98.4°F | Resp 22 | Ht 70.0 in | Wt 189.0 lb

## 2015-02-24 DIAGNOSIS — Z86010 Personal history of colonic polyps: Secondary | ICD-10-CM

## 2015-02-24 MED ORDER — SODIUM CHLORIDE 0.9 % IV SOLN: 500.0000 mL | INTRAVENOUS | Status: DC

## 2015-02-24 NOTE — Op Note (Signed)
[facility]  [address]. [place], [id number]   COLONOSCOPY PROCEDURE REPORT  PATIENT: Terry Miranda, Terry Miranda  MR#: [id number] BIRTHDATE: 1951/10/16 , 63  yrs. old GENDER: male ENDOSCOPIST: Lafayette Dragon, MD REFERRED TG:GYIRSW Alyson Ingles, M.D. PROCEDURE DATE:  02/24/2015 PROCEDURE:   Colonoscopy, surveillance First Screening Colonoscopy - Avg.  risk and is 50 yrs.  old or older - No.  Prior Negative Screening - Now for repeat screening. N/A  History of Adenoma - Now for follow-up colonoscopy & has been > or = to 3 yrs.  Yes hx of adenoma.  Has been 3 or more years since last colonoscopy.  History of Adenoma - Now for follow-up colonoscopy & has been > or = to 3 yrs. 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  MEDICATIONS: Monitored anesthesia care and Propofol 450 mg IV  DESCRIPTION OF PROCEDURE:   After the risks benefits and alternatives of the procedure were thoroughly explained, informed consent was obtained.  The digital rectal exam revealed no abnormalities of the rectum.   The LB [id number]  endoscope was introduced through the anus and advanced to the cecum, which was identified by both the appendix and ileocecal valve. No adverse events experienced.   The quality of the prep was good.  (MoviPrep was used)  The instrument was then slowly withdrawn as the colon was fully examined.      COLON FINDINGS: A normal appearing cecum, ileocecal valve, and appendiceal orifice were identified.  The ascending, transverse, descending, sigmoid colon, and rectum appeared unremarkable. Retroflexed views revealed no abnormalities. The time to cecum = 24.22 Withdrawal time = 5.07   The scope was withdrawn and the procedure completed. COMPLICATIONS: There were no immediate  complications.  ENDOSCOPIC IMPRESSION: Normal colonoscopy  RECOMMENDATIONS: High fiber diet Recall colonoscopy in 10 years  eSigned:  Lafayette Dragon, MD 02/24/2015 8:20 AM   cc:

## 2015-02-24 NOTE — Progress Notes (Signed)
No problems noted in the recovery room. Maw  

## 2015-02-24 NOTE — Patient Instructions (Signed)
YOU HAD AN ENDOSCOPIC PROCEDURE TODAY AT [facility]:   Refer to the procedure report that was given to you for any specific questions about what was found during the examination.  If the procedure report does not answer your questions, please call your gastroenterologist to clarify.  If you requested that your care partner not be given the details of your procedure findings, then the procedure report has been included in a sealed envelope for you to review at your convenience later.  YOU SHOULD EXPECT: Some feelings of bloating in the abdomen. Passage of more gas than usual.  Walking can help get rid of the air that was put into your GI tract during the procedure and reduce the bloating. If you had a lower endoscopy (such as a colonoscopy or flexible sigmoidoscopy) you may notice spotting of blood in your stool or on the toilet paper. If you underwent a bowel prep for your procedure, you may not have a normal bowel movement for a few days.  Please Note:  You might notice some irritation and congestion in your nose or some drainage.  This is from the oxygen used during your procedure.  There is no need for concern and it should clear up in a day or so.  SYMPTOMS TO REPORT IMMEDIATELY:   Following lower endoscopy (colonoscopy or flexible sigmoidoscopy):  Excessive amounts of blood in the stool  Significant tenderness or worsening of abdominal pains  Swelling of the abdomen that is new, acute  Fever of 100F or higher  For urgent or emergent issues, a gastroenterologist can be reached at any hour by calling [phone].   DIET: Your first meal following the procedure should be a small meal and then it is ok to progress to your normal diet. Heavy or fried foods are harder to digest and may make you feel nauseous or bloated.  Likewise, meals heavy in dairy and vegetables can increase bloating.  Drink plenty of fluids but you should avoid alcoholic beverages for 24  hours.  ACTIVITY:  You should plan to take it easy for the rest of today and you should NOT DRIVE or use heavy machinery until tomorrow (because of the sedation medicines used during the test).    FOLLOW UP: Our staff will call the number listed on your records the next business day following your procedure to check on you and address any questions or concerns that you may have regarding the information given to you following your procedure. If we do not reach you, we will leave a message.  However, if you are feeling well and you are not experiencing any problems, there is no need to return our call.  We will assume that you have returned to your regular daily activities without incident.  If any biopsies were taken you will be contacted by phone or by letter within the next 1-3 weeks.  Please call us at [phone] if you have not heard about the biopsies in 3 weeks.    SIGNATURES/CONFIDENTIALITY: You and/or your care partner have signed paperwork which will be entered into your electronic medical record.  These signatures attest to the fact that that the information above on your After Visit Summary has been reviewed and is understood.  Full responsibility of the confidentiality of this discharge information lies with you and/or your care-partner.      You might notice some irritation in your nose or drainage.  This may cause feelings of congestion.  This is from  the oxygen, which can be drying.  This is no cause for concern; this should clear up in a few days.  You may resume your current medications today. Please call if any questions or concerns.

## 2015-02-24 NOTE — Progress Notes (Signed)
Awake, alert and oriented x3, VSS. Pleased with [email or web address]

## 2015-02-25 ENCOUNTER — Telehealth: Payer: Self-pay | Admitting: *Deleted

## 2015-02-25 NOTE — Telephone Encounter (Signed)
  Follow up Call-  Call back number 02/24/2015  Post procedure Call Back phone  # [phone]  Permission to leave phone message Yes     Patient questions:  Do you have a fever, pain , or abdominal swelling? No. Pain Score  0 *  Have you tolerated food without any problems? Yes.    Have you been able to return to your normal activities? Yes.    Do you have any questions about your discharge instructions: Diet   No. Medications  No. Follow up visit  No.  Do you have questions or concerns about your Care? No.  Actions: * If pain score is 4 or above: No action needed, pain <4.

## 2015-02-27 ENCOUNTER — Encounter: Payer: Self-pay | Admitting: Endocrinology

## 2015-02-27 ENCOUNTER — Ambulatory Visit (INDEPENDENT_AMBULATORY_CARE_PROVIDER_SITE_OTHER): Payer: BLUE CROSS/BLUE SHIELD | Admitting: Endocrinology

## 2015-02-27 VITALS — BP 124/82 | HR 65 | Temp 98.5°F | Ht 70.0 in | Wt 191.0 lb

## 2015-02-27 DIAGNOSIS — E890 Postprocedural hypothyroidism: Secondary | ICD-10-CM | POA: Insufficient documentation

## 2015-02-27 NOTE — Progress Notes (Signed)
Patient ID: Terry Miranda, male   DOB: 28-Oct-1951, 64 y.o.   MRN: [id number]    Reason for Appointment:  Post ablative hypothyroidism  followup visit   History of Present Illness:   Hyperthyroidism was first diagnosed in 11/2012 Initial symptoms at diagnosis were shakiness, some fatigue and feeling warm. Also had lost 20 pounds  Treatment in 12/2012 was with 5 mg methimazole twice a day. He had good results with this and the medication was tapered off and stopped in 5/14. Because of relapsing hyperthyroidism in 6/14 with minimal symptoms  he was started back on methimazole 5 mg daily Subsequently with stopping his medication in 11/14 he relapsed with his hyperthyroidism and was restarted on methimazole in 12/14 Since he was not able to wean off his methimazole again he was given 15.9 mCi of I-131 on 03/20/14  He developed  hypothyroidism and was started on Synthroid 112 mcg on 04/24/14 This dose was reduced in 6/15 and is now taking 88 g, 6-1/2 tablets a week On his visit in 9/15 his TSH was low normal and he was told to leave off half a tablet a week  He feels fairly well except is concerned about his weight still continuing to go up gradually He has not had any fatigue He is compliant with his medication and his TSH is now 3.0       Wt Readings from Last 3 Encounters:  02/27/15 191 lb (86.637 kg)  02/24/15 189 lb (85.73 kg)  02/09/15 189 lb (85.73 kg)   LABS:  Lab Results  Component Value Date   FREET4 0.85 02/23/2015   FREET4 1.01 08/14/2014   FREET4 1.25 06/04/2014   FREET4 0.55* 04/09/2014   TSH 3.02 02/23/2015   TSH 0.40 08/14/2014   TSH 0.02* 06/04/2014   TSH 1.76 04/09/2014      Medication List       This list is accurate as of: 02/27/15  4:13 PM.  Always use your most recent med list.               acetaminophen 500 MG tablet  Commonly known as:  TYLENOL  Take 500 mg by mouth every 6 (six) hours as needed.     cholecalciferol 1000 UNITS tablet  Commonly  known as:  VITAMIN D  Take 2,000 Units by mouth daily. Two tablets once a day     levothyroxine 88 MCG tablet  Commonly known as:  SYNTHROID, LEVOTHROID  Take one tablet daily, on Sunday take 1/2 tablet     tamsulosin 0.4 MG Caps capsule  Commonly known as:  FLOMAX  0.4 mg.            Past Medical History  Diagnosis Date  . Arthritis   . Thyroid disease     Past Surgical History  Procedure Laterality Date  . Hernia repair    . Colonoscopy      Family History  Problem Relation Age of Onset  . Thyroid cancer Brother   . Colon cancer Neg Hx     Social History:  reports that he has been smoking Cigarettes.  He has been smoking about 1.00 pack per day. He has never used smokeless tobacco. He reports that he drinks alcohol. He reports that he does not use illicit drugs.  Allergies:  Allergies  Allergen Reactions  . Amoxicillin     REACTION: face swelled  . Ibuprofen Swelling    Face swell    Review of Systems:  CARDIOLOGY: no  history of high blood pressure. Has history of low pulse rate and cardiac murmur   Has previous history of hypercholesterolemia           Examination:   BP 124/82 mmHg  Pulse 65  Temp(Src) 98.5 F (36.9 C) (Oral)  Ht 5\' 10"  (1.778 m)  Wt 191 lb (86.637 kg)  BMI 27.41 kg/m2  SpO2 95%   General Appearance:  looks well        Eyes: No proptosis, minimal puffiness of eyelids         REFLEXES: at biceps are normal.     Assessment/Plan:  Post ablative hypothyroidism:  He is doing clinically well and his TSH is normal although TSH is trending higher now, this is after changing to 6-1/2 tablets a week on the last visit  He will take 1 tablet daily now and follow-up in December for repeat levels   Burnett Lieber 02/27/2015, 4:13 PM

## 2015-02-27 NOTE — Patient Instructions (Signed)
Take 1 pill daily   

## 2015-04-10 ENCOUNTER — Other Ambulatory Visit: Payer: Self-pay | Admitting: Endocrinology

## 2015-10-08 ENCOUNTER — Other Ambulatory Visit: Payer: Self-pay | Admitting: Endocrinology

## 2015-10-15 ENCOUNTER — Ambulatory Visit
Admission: RE | Admit: 2015-10-15 | Discharge: 2015-10-15 | Disposition: A | Discharge status: Home / Self Care | Payer: BLUE CROSS/BLUE SHIELD | Source: Ambulatory Visit | Attending: Family Medicine | Admitting: Family Medicine

## 2015-10-15 ENCOUNTER — Other Ambulatory Visit: Payer: Self-pay | Admitting: Family Medicine

## 2015-10-15 DIAGNOSIS — J209 Acute bronchitis, unspecified: Secondary | ICD-10-CM

## 2015-11-30 ENCOUNTER — Other Ambulatory Visit (INDEPENDENT_AMBULATORY_CARE_PROVIDER_SITE_OTHER): Payer: BLUE CROSS/BLUE SHIELD

## 2015-11-30 DIAGNOSIS — E890 Postprocedural hypothyroidism: Secondary | ICD-10-CM | POA: Diagnosis not present

## 2015-11-30 LAB — TSH: TSH: 1.07 u[IU]/mL (ref 0.35–4.50)

## 2015-12-04 ENCOUNTER — Ambulatory Visit (INDEPENDENT_AMBULATORY_CARE_PROVIDER_SITE_OTHER): Payer: BLUE CROSS/BLUE SHIELD | Admitting: Endocrinology

## 2015-12-04 ENCOUNTER — Encounter: Payer: Self-pay | Admitting: Endocrinology

## 2015-12-04 VITALS — BP 118/74 | HR 50 | Temp 98.5°F | Resp 14 | Ht 70.0 in | Wt 185.6 lb

## 2015-12-04 DIAGNOSIS — E890 Postprocedural hypothyroidism: Secondary | ICD-10-CM

## 2015-12-04 DIAGNOSIS — R001 Bradycardia, unspecified: Secondary | ICD-10-CM

## 2015-12-04 NOTE — Progress Notes (Signed)
Patient ID: Terry Miranda, male   DOB: 09/25/1951, 64 y.o.   MRN: [id number]    Reason for Appointment:  Post ablative hypothyroidism  followup visit   History of Present Illness:   Hyperthyroidism was first diagnosed in 11/2012 Initial symptoms at diagnosis were shakiness, some fatigue and feeling warm. Also had lost 20 pounds  Treatment in 12/2012 was with 5 mg methimazole twice a day. He had good results with this and the medication was tapered off and stopped in 5/14. Because of relapsing hyperthyroidism in 6/14 with minimal symptoms  he was started back on methimazole 5 mg daily Subsequently with stopping his medication in 11/14 he relapsed with his hyperthyroidism and was restarted on methimazole in 12/14 Since he was not able to wean off his methimazole again he was given 15.9 mCi of I-131 on 03/20/14  He developed  hypothyroidism and was started on Synthroid 112 mcg on 04/24/14 This dose was reduced in 6/15 and is now taking 88 g 1 tablet daily  He feels fairly well with no fatigue but thinks he has cold intolerance now, used to be heat intolerant previously His weight has come down He is compliant with his medication and his TSH is now quite normal       Wt Readings from Last 3 Encounters:  12/04/15 185 lb 9.6 oz (84.188 kg)  02/27/15 191 lb (86.637 kg)  02/24/15 189 lb (85.73 kg)   LABS:  Lab Results  Component Value Date   FREET4 0.85 02/23/2015   FREET4 1.01 08/14/2014   FREET4 1.25 06/04/2014   FREET4 0.55* 04/09/2014   TSH 1.07 11/30/2015   TSH 3.02 02/23/2015   TSH 0.40 08/14/2014   TSH 0.02* 06/04/2014      Medication List       This list is accurate as of: 12/04/15 10:30 AM.  Always use your most recent med list.               acetaminophen 500 MG tablet  Commonly known as:  TYLENOL  Take 500 mg by mouth every 6 (six) hours as needed.     BREO ELLIPTA 100-25 MCG/INH Aepb  Generic drug:  Fluticasone Furoate-Vilanterol     CHANTIX STARTING MONTH  PAK 0.5 MG X 11 & 1 MG X 42 tablet  Generic drug:  varenicline     cholecalciferol 1000 UNITS tablet  Commonly known as:  VITAMIN D  Take 2,000 Units by mouth daily. Two tablets once a day     levothyroxine 88 MCG tablet  Commonly known as:  SYNTHROID, LEVOTHROID  TAKE 1 TABLET DAILY AND ON SUNDAYS TAKE ONE-HALF (1/2) TABLET     tamsulosin 0.4 MG Caps capsule  Commonly known as:  FLOMAX  0.4 mg.            Past Medical History  Diagnosis Date  . Arthritis   . Thyroid disease     Past Surgical History  Procedure Laterality Date  . Hernia repair    . Colonoscopy      Family History  Problem Relation Age of Onset  . Thyroid cancer Brother   . Colon cancer Neg Hx   . Diabetes Neg Hx     Social History:  reports that he has been smoking Cigarettes.  He has been smoking about 1.00 pack per day. He has never used smokeless tobacco. He reports that he drinks alcohol. He reports that he does not use illicit drugs.  Allergies:  Allergies  Allergen Reactions  .  Amoxicillin     REACTION: face swelled  . Ibuprofen Swelling    Face swell    Review of Systems:  Has history of low pulse rate and cardiac murmur, had sinus bradycardia on his EKG and 2015   Has previous history of hypercholesterolemia has not followed up with PCP           Examination:   BP 118/74 mmHg  Pulse 50  Temp(Src) 98.5 F (36.9 C) (Oral)  Resp 14  Ht 5\' 10"  (1.778 m)  Wt 185 lb 9.6 oz (84.188 kg)  BMI 26.63 kg/m2  SpO2 98%   General Appearance:  looks well        Eyes:  minimal puffiness of eyelids        Neck exam shows no mass in the thyroid area  REFLEXES: at biceps are normal.     Assessment/Plan:  Post ablative hypothyroidism:  He is doing clinically well with thyroid supplementation using 88 g TSH has been quite normal with this and with taking 1 tablet daily instead of 6-1/2 tablets a week his TSH has not been trending higher  He will take 1 tablet daily of the 88 g and  follow-up in December 2017 for repeat levels and office visit   Terry Miranda 12/04/2015, 10:30 AM

## 2016-03-07 ENCOUNTER — Encounter: Payer: Self-pay | Admitting: Internal Medicine

## 2016-04-03 ENCOUNTER — Other Ambulatory Visit: Payer: Self-pay | Admitting: Endocrinology

## 2016-10-23 DIAGNOSIS — J0100 Acute maxillary sinusitis, unspecified: Secondary | ICD-10-CM | POA: Diagnosis not present

## 2016-11-29 ENCOUNTER — Other Ambulatory Visit (INDEPENDENT_AMBULATORY_CARE_PROVIDER_SITE_OTHER): Payer: BLUE CROSS/BLUE SHIELD

## 2016-11-29 DIAGNOSIS — E890 Postprocedural hypothyroidism: Secondary | ICD-10-CM | POA: Diagnosis not present

## 2016-11-29 LAB — TSH: TSH: 1.13 u[IU]/mL (ref 0.35–4.50)

## 2016-11-29 LAB — T4, FREE: Free T4: 0.85 ng/dL (ref 0.60–1.60)

## 2016-12-02 ENCOUNTER — Encounter: Payer: Self-pay | Admitting: Endocrinology

## 2016-12-02 ENCOUNTER — Ambulatory Visit (INDEPENDENT_AMBULATORY_CARE_PROVIDER_SITE_OTHER): Payer: BLUE CROSS/BLUE SHIELD | Admitting: Endocrinology

## 2016-12-02 VITALS — BP 122/76 | HR 60 | Temp 98.5°F | Resp 16 | Ht 69.0 in | Wt 189.0 lb

## 2016-12-02 DIAGNOSIS — E890 Postprocedural hypothyroidism: Secondary | ICD-10-CM

## 2016-12-02 NOTE — Progress Notes (Signed)
Patient ID: Terry Miranda, male   DOB: [birth year], 65 y.o.   MRN: [id number]    Reason for Appointment:  Post ablative hypothyroidism  followup visit   History of Present Illness:   Hyperthyroidism was first diagnosed in 11/2012 Initial symptoms at diagnosis were shakiness, some fatigue and feeling warm. Also had lost 20 pounds  Treatment in 12/2012 was with 5 mg methimazole twice a day. He had good results with this and the medication was tapered off and stopped in 5/14. Because of relapsing hyperthyroidism in 6/14 with minimal symptoms  he was started back on methimazole 5 mg daily Subsequently with stopping his medication in 11/14 he relapsed with his hyperthyroidism and was restarted on methimazole in 12/14 Since he was not able to wean off his methimazole again he was given 15.9 mCi of I-131 on 03/20/14  He developed  hypothyroidism and was started on Synthroid 112 mcg on 04/24/14 This dose was reduced in 6/15 and is now taking 88 g 1 tablet daily  He feels fairly well with no fatigue or lethargy His weight has fluctuated a little He is compliant with his medication before breakfast daily and his TSH is now consistently normal       Wt Readings from Last 3 Encounters:  12/02/16 189 lb (85.7 kg)  12/04/15 185 lb 9.6 oz (84.2 kg)  02/27/15 191 lb (86.6 kg)   LABS:  Lab Results  Component Value Date   FREET4 0.85 11/29/2016   FREET4 0.85 02/23/2015   FREET4 1.01 08/14/2014   FREET4 1.25 06/04/2014   TSH 1.13 11/29/2016   TSH 1.07 11/30/2015   TSH 3.02 02/23/2015   TSH 0.40 08/14/2014    Allergies as of 12/02/2016      Reactions   Amoxicillin    REACTION: face swelled   Ibuprofen Swelling   Face swell      Medication List       Accurate as of 12/02/16  8:50 AM. Always use your most recent med list.          acetaminophen 500 MG tablet Commonly known as:  TYLENOL Take 500 mg by mouth every 6 (six) hours as needed.   cholecalciferol 1000 units tablet Commonly  known as:  VITAMIN D Take 2,000 Units by mouth daily. Two tablets once a day   levothyroxine 88 MCG tablet Commonly known as:  SYNTHROID, LEVOTHROID TAKE 1 TABLET DAILY AND ON SUNDAYS TAKE ONE-HALF (1/2) TABLET   tamsulosin 0.4 MG Caps capsule Commonly known as:  FLOMAX 0.4 mg.           Past Medical History:  Diagnosis Date  . Arthritis   . Thyroid disease     Past Surgical History:  Procedure Laterality Date  . COLONOSCOPY    . HERNIA REPAIR      Family History  Problem Relation Age of Onset  . Thyroid cancer Brother   . Colon cancer Neg Hx   . Diabetes Neg Hx     Social History:  reports that he has been smoking Cigarettes.  He has been smoking about 1.00 pack per day. He has never used smokeless tobacco. He reports that he drinks alcohol. He reports that he does not use drugs.  Allergies:  Allergies  Allergen Reactions  . Amoxicillin     REACTION: face swelled  . Ibuprofen Swelling    Face swell    Review of Systems:  Has history of low pulse rate and cardiac murmur, had sinus bradycardia on  his EKG and 2015   Has previous history of hypercholesterolemia has not followed up with PCP           Examination:   BP 122/76   Pulse 60   Temp 98.5 F (36.9 C)   Resp 16   Ht 5\' 9"  (1.753 m)   Wt 189 lb (85.7 kg)   BMI 27.91 kg/m    General Appearance:  looks well       Neck exam shows no mass in the thyroid area  REFLEXES: at biceps are normal.     Assessment/Plan:  Post ablative hypothyroidism:  He is doing clinically well with thyroid supplementation using 88 gOnce a day He is taking this as directed before breakfast Discussed not taking any iron or calcium at the same time  TSH has been quite normal with this and consistent  He will continue to take 1 tablet daily of the 88 g and follow-up in December 2018 for repeat levels and office visit   Terry Miranda 12/02/2016, 8:50 AM

## 2016-12-30 ENCOUNTER — Other Ambulatory Visit: Payer: Self-pay | Admitting: Endocrinology

## 2016-12-30 ENCOUNTER — Telehealth: Payer: Self-pay | Admitting: Endocrinology

## 2016-12-30 NOTE — Telephone Encounter (Signed)
Please call in the [facility] Pt is aware it is sent in already

## 2017-05-11 DIAGNOSIS — F17200 Nicotine dependence, unspecified, uncomplicated: Secondary | ICD-10-CM | POA: Diagnosis not present

## 2017-07-11 ENCOUNTER — Telehealth: Payer: Self-pay | Admitting: Endocrinology

## 2017-07-11 DIAGNOSIS — R350 Frequency of micturition: Secondary | ICD-10-CM | POA: Diagnosis not present

## 2017-07-11 DIAGNOSIS — N401 Enlarged prostate with lower urinary tract symptoms: Secondary | ICD-10-CM | POA: Diagnosis not present

## 2017-07-11 NOTE — Telephone Encounter (Signed)
Patient's wife calling in reference to patient's hands shaking. Patient wants to know if this is a side effect of levothyroxine (SYNTHROID, LEVOTHROID) 88 MCG tablet.   Please call patient and advise. OK to leave message

## 2017-07-11 NOTE — Telephone Encounter (Signed)
This is not a side effect but his thyroid functions can be rechecked again, he can be seen sooner than December for his labs and office visit

## 2017-07-14 NOTE — Telephone Encounter (Signed)
Please call to give the message to Mrs. Ciszewski as below

## 2017-07-17 NOTE — Telephone Encounter (Signed)
He will have to be evaluated by his primary doctor or neurologist

## 2017-07-17 NOTE — Telephone Encounter (Signed)
Patients wife would like to know what else could be checked possibly as far as labs go, or what could possibly be causing the shaking of his hands? Advise?

## 2017-07-17 NOTE — Telephone Encounter (Signed)
Called and left VM that he should follow up with a primary or neurologist. If there were questions he could call back.

## 2017-09-26 ENCOUNTER — Other Ambulatory Visit: Payer: Self-pay | Admitting: Endocrinology

## 2017-11-27 ENCOUNTER — Other Ambulatory Visit (INDEPENDENT_AMBULATORY_CARE_PROVIDER_SITE_OTHER): Payer: BLUE CROSS/BLUE SHIELD

## 2017-11-27 DIAGNOSIS — E890 Postprocedural hypothyroidism: Secondary | ICD-10-CM

## 2017-11-27 LAB — TSH: TSH: 0.68 u[IU]/mL (ref 0.35–4.50)

## 2017-11-27 LAB — T4, FREE: Free T4: 1.01 ng/dL (ref 0.60–1.60)

## 2017-11-28 ENCOUNTER — Ambulatory Visit: Payer: BLUE CROSS/BLUE SHIELD | Admitting: Endocrinology

## 2017-11-28 ENCOUNTER — Encounter: Payer: Self-pay | Admitting: Endocrinology

## 2017-11-28 VITALS — BP 136/64 | HR 56 | Ht 69.0 in | Wt 191.0 lb

## 2017-11-28 DIAGNOSIS — E890 Postprocedural hypothyroidism: Secondary | ICD-10-CM | POA: Diagnosis not present

## 2017-11-28 NOTE — Progress Notes (Signed)
Patient ID: Terry Miranda, male   DOB: [birth year], 66 y.o.   MRN: [id number]    Reason for Appointment:  Post ablative hypothyroidism  followup visit   History of Present Illness:   Hyperthyroidism was first diagnosed in 11/2012 Initial symptoms at diagnosis were shakiness, some fatigue and feeling warm. Also had lost 20 pounds  Treatment in 12/2012 was with 5 mg methimazole twice a day. He had good results with this and the medication was tapered off and stopped in 5/14. Because of relapsing hyperthyroidism in 6/14 with minimal symptoms  he was started back on methimazole 5 mg daily Subsequently with stopping his medication in 11/14 he relapsed with his hyperthyroidism and was restarted on methimazole in 12/14 Since he was not able to wean off his methimazole again he was given 15.9 mCi of I-131 on 03/20/14  He developed  hypothyroidism and was started on Synthroid 112 mcg on 04/24/14 This dose was reduced in 6/15 and is since then taking 88 g daily  He was told to take 6-1/2 tablets a week because of relatively low TSH previously but he forgets to do this and takes one daily  He feels fairly well with no unusual fatigue or cold intolerance His weight has fluctuated a little and is slightly higher  He is compliant with his medication before breakfast daily  TSH is now relatively lower 0.68 compared to 1.1 last year       Wt Readings from Last 3 Encounters:  11/28/17 191 lb (86.6 kg)  12/02/16 189 lb (85.7 kg)  12/04/15 185 lb 9.6 oz (84.2 kg)   LABS:  Lab Results  Component Value Date   FREET4 1.01 11/27/2017   FREET4 0.85 11/29/2016   FREET4 0.85 02/23/2015   FREET4 1.01 08/14/2014   TSH 0.68 11/27/2017   TSH 1.13 11/29/2016   TSH 1.07 11/30/2015   TSH 3.02 02/23/2015    Allergies as of 11/28/2017      Reactions   Amoxicillin    REACTION: face swelled   Ibuprofen Swelling   Face swell      Medication List        Accurate as of 11/28/17  3:50 PM.  Always use your most recent med list.          acetaminophen 500 MG tablet Commonly known as:  TYLENOL Take 500 mg by mouth every 6 (six) hours as needed.   cholecalciferol 1000 units tablet Commonly known as:  VITAMIN D Take 2,000 Units by mouth daily. Two tablets once a day   levothyroxine 88 MCG tablet Commonly known as:  SYNTHROID, LEVOTHROID TAKE 1 TABLET DAILY AND ON SUNDAYS TAKE ONE-HALF (1/2) TABLET   tamsulosin 0.4 MG Caps capsule Commonly known as:  FLOMAX 0.4 mg.           Past Medical History:  Diagnosis Date  . Arthritis   . Thyroid disease     Past Surgical History:  Procedure Laterality Date  . COLONOSCOPY    . HERNIA REPAIR      Family History  Problem Relation Age of Onset  . Thyroid cancer Brother   . Colon cancer Neg Hx   . Diabetes Neg Hx     Social History:  reports that he has been smoking cigarettes.  He has been smoking about 1.00 pack per day. he has never used smokeless tobacco. He reports that he drinks alcohol. He reports that he does not use drugs.  Allergies:  Allergies  Allergen  Reactions  . Amoxicillin     REACTION: face swelled  . Ibuprofen Swelling    Face swell    Review of Systems:  Has history of low pulse rate and cardiac murmur, had sinus bradycardia on his EKG and 2015    Has previous history of hypercholesterolemia   He is asking about his right hand shaking sometimes when he is holding it in a certain position on the desk He is due to see his PCP for general evaluation         Examination:   BP 136/64   Pulse (!) 56   Ht 5\' 9"  (1.753 m)   Wt 191 lb (86.6 kg)   SpO2 97%   BMI 28.21 kg/m    General Appearance:  looks well       Neck exam shows no thyroid enlargement  REFLEXES: at biceps are normal.  No tremor    Assessment/Plan:  Post ablative hypothyroidism:  He is doing clinically well with no symptoms of hypo-or hyperthyroidism Has been for some time on thyroid supplementation using 88 g,  taking Once a day He is taking this as directed quite regularly before breakfast  Exam is normal TSH has been quite normal with this but now it is low normal at 0.7  Advised him to try taking the tablet 6-1/2 days a week now instead of daily  He will follow-up with PCP for other questions that he had today Will see him again in one year   Elayne Snare 11/28/2017, 3:50 PM

## 2018-01-01 DIAGNOSIS — E7800 Pure hypercholesterolemia, unspecified: Secondary | ICD-10-CM | POA: Diagnosis not present

## 2018-01-01 DIAGNOSIS — F17210 Nicotine dependence, cigarettes, uncomplicated: Secondary | ICD-10-CM | POA: Diagnosis not present

## 2018-01-01 DIAGNOSIS — E559 Vitamin D deficiency, unspecified: Secondary | ICD-10-CM | POA: Diagnosis not present

## 2018-01-01 DIAGNOSIS — E538 Deficiency of other specified B group vitamins: Secondary | ICD-10-CM | POA: Diagnosis not present

## 2018-01-01 DIAGNOSIS — R5383 Other fatigue: Secondary | ICD-10-CM | POA: Diagnosis not present

## 2018-02-24 DIAGNOSIS — J018 Other acute sinusitis: Secondary | ICD-10-CM | POA: Diagnosis not present

## 2018-03-12 ENCOUNTER — Other Ambulatory Visit: Payer: Self-pay | Admitting: Dermatology

## 2018-03-12 DIAGNOSIS — L720 Epidermal cyst: Secondary | ICD-10-CM | POA: Diagnosis not present

## 2018-03-12 DIAGNOSIS — L570 Actinic keratosis: Secondary | ICD-10-CM | POA: Diagnosis not present

## 2018-03-12 DIAGNOSIS — D229 Melanocytic nevi, unspecified: Secondary | ICD-10-CM | POA: Diagnosis not present

## 2018-03-12 DIAGNOSIS — D485 Neoplasm of uncertain behavior of skin: Secondary | ICD-10-CM | POA: Diagnosis not present

## 2018-06-25 ENCOUNTER — Other Ambulatory Visit: Payer: Self-pay | Admitting: Endocrinology

## 2018-07-21 ENCOUNTER — Other Ambulatory Visit: Payer: Self-pay

## 2018-07-21 ENCOUNTER — Emergency Department (HOSPITAL_COMMUNITY)
Admission: EM | Admit: 2018-07-21 | Discharge: 2018-07-21 | Disposition: A | Discharge status: Home / Self Care | Payer: BLUE CROSS/BLUE SHIELD | Attending: Emergency Medicine | Admitting: Emergency Medicine

## 2018-07-21 ENCOUNTER — Encounter (HOSPITAL_COMMUNITY): Payer: Self-pay | Admitting: *Deleted

## 2018-07-21 DIAGNOSIS — R040 Epistaxis: Secondary | ICD-10-CM | POA: Insufficient documentation

## 2018-07-21 DIAGNOSIS — F17210 Nicotine dependence, cigarettes, uncomplicated: Secondary | ICD-10-CM | POA: Insufficient documentation

## 2018-07-21 DIAGNOSIS — E039 Hypothyroidism, unspecified: Secondary | ICD-10-CM | POA: Insufficient documentation

## 2018-07-21 DIAGNOSIS — Z79899 Other long term (current) drug therapy: Secondary | ICD-10-CM | POA: Insufficient documentation

## 2018-07-21 MED ORDER — CLINDAMYCIN HCL 150 MG PO CAPS: 150.0000 mg | ORAL_CAPSULE | Freq: Four times a day (QID) | ORAL | 0 refills | Status: AC

## 2018-07-21 MED ORDER — OXYCODONE-ACETAMINOPHEN 5-325 MG PO TABS: 1.0000 | ORAL_TABLET | ORAL | 0 refills | Status: AC | PRN

## 2018-07-21 MED ORDER — OXYMETAZOLINE HCL 0.05 % NA SOLN
1.0000 | Freq: Once | NASAL | Status: AC
  Administered 2018-07-21: 1 via NASAL
  Filled 2018-07-21: qty 15

## 2018-07-21 NOTE — ED Triage Notes (Signed)
Onset of nosebleed about 2045 tonight. Actively bleeding in triage. No blood thinners. Reports did have nosebleed this morning that stopped.

## 2018-07-21 NOTE — Discharge Instructions (Signed)
I have provided antibiotics please take as directed.I have also provided a referral for ENT please call Monday to schedule an appointment for packing removal and follow up.If your symptoms worsen please return to the ED for reevaluation.

## 2018-07-21 NOTE — ED Provider Notes (Signed)
  Physical Exam  BP (!) 157/84 (BP Location: Right Arm)   Pulse 62   Temp 98.4 F (36.9 C) (Oral)   Resp 18   SpO2 98%   Physical Exam  ED Course/Procedures     .Epistaxis Management Date/Time: 07/21/2018 10:33 PM Performed by: Merrily Pew, MD Authorized by: Merrily Pew, MD   Consent:    Consent obtained:  Verbal   Consent given by:  Patient   Risks discussed:  Bleeding, infection and nasal injury   Alternatives discussed:  Delayed treatment and observation Anesthesia (see MAR for exact dosages):    Anesthesia method:  None Procedure details:    Treatment site:  L posterior and L anterior   Treatment method:  Nasal balloon   Treatment complexity:  Limited   Treatment episode: initial   Post-procedure details:    Assessment:  No improvement   Patient tolerance of procedure:  Tolerated well, no immediate complications    MDM   67 year old male with unprovoked epistaxis from his left nare.  Unable to visualize initially.  Attempts have been made to pack and get Afrin prior to my arrival.  Balloon placed as above with 10 cc of air which improved symptoms.  Observed for 30 minutes without bleeding.  Took off a couple cc to alleviate some of the discomfort which seemed to help with discomfort.       Merrily Pew, MD 07/21/18 [phone]

## 2018-07-21 NOTE — ED Provider Notes (Signed)
[facility] DEPT Provider Note   CSN: [id number] Arrival date & time: 07/21/18  2035     History   Chief Complaint Chief Complaint  Patient presents with  . Epistaxis    HPI Terry Miranda is a 67 y.o. male.  67 y.o male with no PMH present to the ED with a chief complaint of epixtasis x 1 hour ago.  Patient states he was watching TV when all of a sudden his nose began to bleed, him and his wife rushed to the fire department.  He was told he needed to come into the ED for further management.  And tried applying pressure to his nose does not stop the bleeding.Patient reports bleeding was coming from both nostrils.However, the right nostril has stopped but the left one continues to bleed. Patient is not on blood thinners. He denies any chest pain, diaphoresis, shortness of breath of lightheadedness.      Past Medical History:  Diagnosis Date  . Arthritis   . Thyroid disease     Patient Active Problem List   Diagnosis Date Noted  . Hypothyroidism, postradioiodine therapy 02/27/2015  . Other postablative hypothyroidism 04/24/2014    Past Surgical History:  Procedure Laterality Date  . COLONOSCOPY    . HERNIA REPAIR          Home Medications    Prior to Admission medications   Medication Sig Start Date End Date Taking? Authorizing Provider  acetaminophen (TYLENOL) 500 MG tablet Take 500 mg by mouth every 6 (six) hours as needed.    [provider]  cholecalciferol (VITAMIN D) 1000 UNITS tablet Take 2,000 Units by mouth daily. Two tablets once a day    [provider]  clindamycin (CLEOCIN) 150 MG capsule Take 1 capsule (150 mg total) by mouth every 6 (six) hours for 7 days. 07/21/18 07/28/18  Janeece Fitting, PA-C  levothyroxine (SYNTHROID, LEVOTHROID) 88 MCG tablet TAKE 1 TABLET DAILY AND ON SUNDAYS TAKE ONE-HALF (1/2) TABLET 06/25/18   Elayne Snare, MD  tamsulosin [facility]) 0.4 MG CAPS capsule 0.4 mg. 04/23/13   [provider]    Family History Family History  Problem Relation Age of Onset  . Thyroid cancer Brother   . Colon cancer Neg Hx   . Diabetes Neg Hx     Social History Social History   Tobacco Use  . Smoking status: Current Every Day Smoker    Packs/day: 1.00    Types: Cigarettes  . Smokeless tobacco: Never Used  Substance Use Topics  . Alcohol use: Yes    Comment: rare  . Drug use: No     Allergies   Amoxicillin and Ibuprofen   Review of Systems Review of Systems  Constitutional: Negative for chills and fever.  HENT: Positive for nosebleeds. Negative for congestion, ear pain, postnasal drip, rhinorrhea, sinus pressure, sinus pain and sore throat.   Eyes: Negative for pain and visual disturbance.  Respiratory: Negative for cough and shortness of breath.   Cardiovascular: Negative for chest pain and palpitations.  Gastrointestinal: Negative for abdominal pain and vomiting.  Genitourinary: Negative for dysuria and hematuria.  Musculoskeletal: Negative for arthralgias and back pain.  Skin: Negative for color change and rash.  Neurological: Negative for seizures and syncope.  All other systems reviewed and are negative.    Physical Exam Updated Vital Signs BP (!) 151/73 (BP Location: Right Arm)   Pulse (!) 55   Temp 98.4 F (36.9 C) (Oral)   Resp 20  SpO2 99%   Physical Exam  Constitutional: He is oriented to person, place, and time. He appears well-developed and well-nourished.  HENT:  Head: Normocephalic and atraumatic.  Nose: Epistaxis is observed.    Mouth/Throat: Oropharynx is clear and moist.  Active bleeding from left nostril, packing placed with no relieve.   Eyes: Pupils are equal, round, and reactive to light. No scleral icterus.  Neck: Normal range of motion.  Cardiovascular: Normal heart sounds.  Pulmonary/Chest: Effort normal and breath sounds normal. He has no wheezes. He exhibits no tenderness.  Abdominal: Soft. Bowel sounds are normal.  He exhibits no distension. There is no tenderness.  Musculoskeletal: He exhibits no tenderness or deformity.  Neurological: He is alert and oriented to person, place, and time.  Skin: Skin is warm and dry. Capillary refill takes less than 2 seconds.  Nursing note and vitals reviewed.    ED Treatments / Results  Labs (all labs ordered are listed, but only abnormal results are displayed) Labs Reviewed - No data to display  EKG None  Radiology No results found.  Procedures Procedures (including critical care time)  Medications Ordered in ED Medications  oxymetazoline (AFRIN) 0.05 % nasal spray 1 spray (1 spray Each Nare Given 07/21/18 2050)     Initial Impression / Assessment and Plan / ED Course  I have reviewed the triage vital signs and the nursing notes.  Pertinent labs & imaging results that were available during my care of the patient were reviewed by me and considered in my medical decision making (see chart for details).     Patient presents with epistaxis x1 hour. Direct pressure, Afrin along with packing was tried with no relieve in symptoms.   Rapid rhino was inserted into left nostril by Dr. Dayna Barker, patient tolerated well. Bleeding has resolved. Patient will be send home with rapid rhino in place and ENT follow up. Patient discharge with pain control and clindamycin. Vitals stable during visit, patient is stable for discharge. Return precautions provided.  Final Clinical Impressions(s) / ED Diagnoses   Final diagnoses:  Epistaxis    ED Discharge Orders         Ordered    clindamycin (CLEOCIN) 150 MG capsule  Every 6 hours     07/21/18 2238           Janeece Fitting, PA-C 07/21/18 2254    Merrily Pew, MD 07/21/18 [phone]

## 2018-07-25 DIAGNOSIS — R040 Epistaxis: Secondary | ICD-10-CM | POA: Diagnosis not present

## 2018-11-30 ENCOUNTER — Other Ambulatory Visit: Payer: Self-pay | Admitting: Endocrinology

## 2018-11-30 ENCOUNTER — Other Ambulatory Visit (INDEPENDENT_AMBULATORY_CARE_PROVIDER_SITE_OTHER): Payer: BLUE CROSS/BLUE SHIELD

## 2018-11-30 DIAGNOSIS — E890 Postprocedural hypothyroidism: Secondary | ICD-10-CM

## 2018-11-30 LAB — T4, FREE: Free T4: 0.77 ng/dL (ref 0.60–1.60)

## 2018-11-30 LAB — TSH: TSH: 2.74 u[IU]/mL (ref 0.35–4.50)

## 2018-12-03 ENCOUNTER — Other Ambulatory Visit: Payer: BLUE CROSS/BLUE SHIELD

## 2018-12-07 ENCOUNTER — Ambulatory Visit: Payer: BLUE CROSS/BLUE SHIELD | Admitting: Endocrinology

## 2018-12-07 ENCOUNTER — Encounter: Payer: Self-pay | Admitting: Endocrinology

## 2018-12-07 VITALS — BP 130/76 | HR 51 | Ht 69.0 in | Wt 186.0 lb

## 2018-12-07 DIAGNOSIS — E890 Postprocedural hypothyroidism: Secondary | ICD-10-CM

## 2018-12-07 NOTE — Progress Notes (Signed)
Patient ID: Terry Miranda, male   DOB: May 08, 1951, 67 y.o.   MRN: [id number]    Reason for Appointment:  Post ablative hypothyroidism  followup visit   History of Present Illness:   Hyperthyroidism was first diagnosed in 11/2012 Initial symptoms at diagnosis were shakiness, some fatigue and feeling warm. Also had lost 20 pounds  Treatment in 12/2012 was with 5 mg methimazole twice a day. He had good results with this and the medication was tapered off and stopped in 5/14. Because of relapsing hyperthyroidism in 6/14 with minimal symptoms  he was started back on methimazole 5 mg daily Subsequently with stopping his medication in 11/14 he relapsed with his hyperthyroidism and was restarted on methimazole in 12/14 Since he was not able to wean off his methimazole again he was given 15.9 mCi of I-131 on 03/20/14  He developed  hypothyroidism and was started on Synthroid 112 mcg on 04/24/14 This dose was reduced in 6/15 and is since then taking 88 g daily  He was told to take 6-1/2 tablets a week because of relatively low TSH in 12/18 when his TSH was 0.68 He has done this as directed Does not feel any different since he changed the dose He has lost some weight but without trying, no shakiness or heat intolerance No unusual fatigue also   He is compliant with his medication with water 30 minutes before breakfast daily, currently taking generic levothyroxine  TSH is normal at 2.7       Wt Readings from Last 3 Encounters:  12/07/18 186 lb (84.4 kg)  11/28/17 191 lb (86.6 kg)  12/02/16 189 lb (85.7 kg)   LABS:  Lab Results  Component Value Date   FREET4 0.77 11/30/2018   FREET4 1.01 11/27/2017   FREET4 0.85 11/29/2016   FREET4 0.85 02/23/2015   TSH 2.74 11/30/2018   TSH 0.68 11/27/2017   TSH 1.13 11/29/2016   TSH 1.07 11/30/2015    Allergies as of 12/07/2018      Reactions   Amoxicillin    REACTION: face swelled   Ibuprofen Swelling   Face swell      Medication  List       Accurate as of December 07, 2018  8:00 AM. Always use your most recent med list.        acetaminophen 500 MG tablet Commonly known as:  TYLENOL Take 500 mg by mouth every 6 (six) hours as needed.   cholecalciferol 1000 units tablet Commonly known as:  VITAMIN D Take 2,000 Units by mouth daily. Two tablets once a day   cyanocobalamin 1000 MCG tablet Take 1,000 mcg by mouth daily.   levothyroxine 88 MCG tablet Commonly known as:  SYNTHROID, LEVOTHROID TAKE 1 TABLET DAILY AND ON SUNDAYS TAKE ONE-HALF (1/2) TABLET   tamsulosin 0.4 MG Caps capsule Commonly known as:  FLOMAX 0.4 mg.           Past Medical History:  Diagnosis Date  . Arthritis   . Thyroid disease     Past Surgical History:  Procedure Laterality Date  . COLONOSCOPY    . HERNIA REPAIR      Family History  Problem Relation Age of Onset  . Thyroid cancer Brother   . Colon cancer Neg Hx   . Diabetes Neg Hx     Social History:  reports that he has been smoking cigarettes. He has been smoking about 1.00 pack per day. He has never used smokeless tobacco. He reports  current alcohol use. He reports that he does not use drugs.  Allergies:  Allergies  Allergen Reactions  . Amoxicillin     REACTION: face swelled  . Ibuprofen Swelling    Face swell    Review of Systems:  Has history of low pulse rate and cardiac murmur, had sinus bradycardia on his EKG and 2015    Has previous history of hypercholesterolemia   He is asking about his right hand shaking sometimes when he is holding it in a certain position on the desk He is due to see his PCP for general evaluation         Examination:   BP 130/76 (BP Location: Left Arm, Patient Position: Sitting, Cuff Size: Normal)   Pulse (!) 51   Ht 5\' 9"  (1.753 m)   Wt 186 lb (84.4 kg)   SpO2 97%   BMI 27.47 kg/m   Thyroid not palpable Submandibular salivary glands palpable, relatively soft and nontender No lymphadenopathy in the neck Skin not  unusually dry     Assessment/Plan:  Post ablative hypothyroidism:  He has been on thyroid supplements for over 4 years now  He is on a consistent prescription with 88 mcg, currently taking 6-1/2 tablets a week He has been following instructions with taking the medication daily before breakfast consistently  Exam is normal TSH has been quite normal and now 2.7  No change in medication recommended  He will follow-up with PCP for preventive physical exam which he has not done for a while  Will see him again in one year   Elayne Snare 12/07/2018, 8:00 AM

## 2018-12-26 ENCOUNTER — Telehealth: Payer: Self-pay | Admitting: Endocrinology

## 2018-12-26 ENCOUNTER — Other Ambulatory Visit: Payer: Self-pay

## 2018-12-26 MED ORDER — LEVOTHYROXINE SODIUM 88 MCG PO TABS: ORAL_TABLET | ORAL | 4 refills | Status: DC

## 2018-12-26 NOTE — Telephone Encounter (Signed)
Rx sent to pharmacy requested in 90 day supply.  Upon review of chart, there is no note from staff or pharmacy until this note, stating that patient was in need of a refill.

## 2018-12-26 NOTE — Telephone Encounter (Signed)
Patient called [facility] and stated that his thyroid medication has not been sent to the pharmacy. And he has been waiting for 2 weeks. He has a week supply left of his medication  levothyroxine (SYNTHROID, LEVOTHROID) 88 MCG tablet  [facility] [address] Portal to Registered [employer] Sites

## 2018-12-28 DIAGNOSIS — N5201 Erectile dysfunction due to arterial insufficiency: Secondary | ICD-10-CM | POA: Diagnosis not present

## 2018-12-28 DIAGNOSIS — R351 Nocturia: Secondary | ICD-10-CM | POA: Diagnosis not present

## 2018-12-28 DIAGNOSIS — N401 Enlarged prostate with lower urinary tract symptoms: Secondary | ICD-10-CM | POA: Diagnosis not present

## 2019-09-20 ENCOUNTER — Telehealth: Payer: Self-pay | Admitting: Endocrinology

## 2019-09-20 ENCOUNTER — Other Ambulatory Visit: Payer: Self-pay

## 2019-09-20 MED ORDER — LEVOTHYROXINE SODIUM 88 MCG PO TABS: ORAL_TABLET | ORAL | 1 refills | Status: DC

## 2019-09-20 NOTE — Telephone Encounter (Signed)
Rx sent 

## 2019-09-20 NOTE — Telephone Encounter (Signed)
MEDICATION: levothyroxine (SYNTHROID, LEVOTHROID) 88 MCG tablet  PHARMACY[facility] [address] [facility], [facility]  IS PATIENT OUT OF MEDICATION: No  IF NOT; HOW MUCH IS LEFT: 4  LAST APPOINTMENT DATE: @Visit  date not found  NEXT APPOINTMENT DATE:@12 /18/2020  DO WE HAVE YOUR PERMISSION TO LEAVE A DETAILED MESSAGE: Yes  OTHER COMMENTS:    **Let patient know to contact pharmacy at the end of the day to make sure medication is ready. **  ** Please notify patient to allow 48-72 hours to process**  **Encourage patient to contact the pharmacy for refills or they can request refills through [facility]**

## 2019-11-29 ENCOUNTER — Other Ambulatory Visit (INDEPENDENT_AMBULATORY_CARE_PROVIDER_SITE_OTHER): Payer: Self-pay

## 2019-11-29 ENCOUNTER — Other Ambulatory Visit: Payer: Self-pay

## 2019-11-29 DIAGNOSIS — E890 Postprocedural hypothyroidism: Secondary | ICD-10-CM

## 2019-11-29 LAB — T4, FREE: Free T4: 0.94 ng/dL (ref 0.60–1.60)

## 2019-11-29 LAB — TSH: TSH: 2.06 u[IU]/mL (ref 0.35–4.50)

## 2019-12-03 NOTE — Progress Notes (Signed)
Patient ID: Terry Miranda, male   DOB: 1951/05/23, 68 y.o.   MRN: [id number]    Reason for Appointment:  Post ablative hypothyroidism  followup visit   History of Present Illness:   Hyperthyroidism was first diagnosed in 11/2012 Initial symptoms at diagnosis were shakiness, some fatigue and feeling warm. Also had lost 20 pounds  Treatment in 12/2012 was with 5 mg methimazole twice a day. He had good results with this and the medication was tapered off and stopped in 5/14. Because of relapsing hyperthyroidism in 6/14 with minimal symptoms  he was started back on methimazole 5 mg daily Subsequently with stopping his medication in 11/14 he relapsed with his hyperthyroidism and was restarted on methimazole in 12/14 Since he was not able to wean off his methimazole again he was given 15.9 mCi of I-131 on 03/20/14  He developed  hypothyroidism and was started on Synthroid 112 mcg on 04/24/14 This dose was reduced in 6/15 and is since then taking 88 g daily  He has been taking 6-1/2 tablets a week of the 88 mcg strength when he had a relatively low TSH in 12/18 of 0.68 Subsequently has had consistently normal TSH level He is subjectively doing well with no symptoms of tiredness, also no shakiness recently His weight is about the same  He is compliant with his levothyroxine with water 30 minutes before breakfast daily, [facility]  TSH is normal at 2.1       Wt Readings from Last 3 Encounters:  12/04/19 189 lb 12.8 oz (86.1 kg)  12/07/18 186 lb (84.4 kg)  11/28/17 191 lb (86.6 kg)   LABS:  Lab Results  Component Value Date   FREET4 0.94 11/29/2019   FREET4 0.77 11/30/2018   FREET4 1.01 11/27/2017   FREET4 0.85 11/29/2016   TSH 2.06 11/29/2019   TSH 2.74 11/30/2018   TSH 0.68 11/27/2017   TSH 1.13 11/29/2016    Allergies as of 12/04/2019      Reactions   Amoxicillin    REACTION: face swelled   Ibuprofen Swelling   Face swell       Medication List       Accurate as of December 04, 2019  8:08 AM. If you have any questions, ask your nurse or doctor.        acetaminophen 500 MG tablet Commonly known as: TYLENOL Take 500 mg by mouth every 6 (six) hours as needed.   cholecalciferol 1000 units tablet Commonly known as: VITAMIN D Take 2,000 Units by mouth daily. Two tablets once a day   cyanocobalamin 1000 MCG tablet Take 1,000 mcg by mouth daily.   levothyroxine 88 MCG tablet Commonly known as: SYNTHROID TAKE 1 TABLET DAILY AND ON SUNDAYS TAKE ONE-HALF (1/2) TABLET   tamsulosin 0.4 MG Caps capsule Commonly known as: FLOMAX 0.4 mg.           Past Medical History:  Diagnosis Date  . Arthritis   . Atypical nevus 02/11/2008   Left Lower Deltoid-Slight  . Atypical nevus 08/26/2003   Upper Mid Right Back Sup-Slight to Moderate (w/s), Upper Mid Right Back Inf(Slight), Mid Back-Slight to Moderate(w/s), Mid Right Back Sup-Slight to Moderate(w/s), and Mid Right Back Inf-Slight to Moderate(w/s)  . Thyroid disease     Past Surgical History:  Procedure Laterality Date  . COLONOSCOPY    . HERNIA REPAIR      Family History  Problem Relation Age of Onset  . Thyroid cancer  Brother   . Colon cancer Neg Hx   . Diabetes Neg Hx     Social History:  reports that he has been smoking cigarettes. He has been smoking about 1.00 pack per day. He has never used smokeless tobacco. He reports current alcohol use. He reports that he does not use drugs.  Allergies:  Allergies  Allergen Reactions  . Amoxicillin     REACTION: face swelled  . Ibuprofen Swelling    Face swell    Review of Systems:  Has history of low pulse rate and cardiac murmur, had sinus bradycardia on his EKG previously in 2015    Has previous history of hypercholesterolemia but has not established with a new PCP        Examination:   BP 122/70 (BP Location: Left Arm, Patient Position: Sitting, Cuff Size: Normal)   Pulse 68   Ht 5\' 9"   (1.753 m)   Wt 189 lb 12.8 oz (86.1 kg)   SpO2 90%   BMI 28.03 kg/m   Thyroid not palpable No tremor   Assessment/Plan:  Post ablative hypothyroidism:  He is on a consistent regimen with 88 mcg generic levothyroxine, and has been taking 6-1/2 tablets a week He is doing well subjectively Is quite consistent with taking his levothyroxine before breakfast daily and not missing any doses  TSH is very consistently normal around 2.0 He will continue his regimen unchanged  He will need to establish with a PCP for preventive physical exam which he has not done for a while  Will see him again in one year   Elayne Snare 12/04/2019, 8:08 AM

## 2019-12-04 ENCOUNTER — Other Ambulatory Visit: Payer: Self-pay

## 2019-12-04 ENCOUNTER — Ambulatory Visit (INDEPENDENT_AMBULATORY_CARE_PROVIDER_SITE_OTHER): Payer: PPO | Admitting: Endocrinology

## 2019-12-04 ENCOUNTER — Encounter: Payer: Self-pay | Admitting: Endocrinology

## 2019-12-04 VITALS — BP 122/70 | HR 68 | Ht 69.0 in | Wt 189.8 lb

## 2019-12-04 DIAGNOSIS — E890 Postprocedural hypothyroidism: Secondary | ICD-10-CM

## 2019-12-15 ENCOUNTER — Other Ambulatory Visit: Payer: Self-pay | Admitting: Urology

## 2019-12-18 ENCOUNTER — Telehealth: Payer: Self-pay

## 2019-12-18 NOTE — Telephone Encounter (Signed)
MEDICATION: levothyroxine (SYNTHROID) 88 MCG tablet  PHARMACY:  [id number] - [place], [place] - [address]  IS THIS A 90 DAY SUPPLY : yes   IS PATIENT OUT OF MEDICATION: no  IF NOT; HOW MUCH IS LEFT: 2 days left   LAST APPOINTMENT DATE: @12 /23/2020  NEXT APPOINTMENT DATE:@Visit  date not found  DO WE HAVE YOUR PERMISSION TO LEAVE A DETAILED MESSAGE:  OTHER COMMENTS:    **Let patient know to contact pharmacy at the end of the day to make sure medication is ready. **  ** Please notify patient to allow 48-72 hours to process**  **Encourage patient to contact the pharmacy for refills or they can request refills through [facility]**

## 2019-12-19 ENCOUNTER — Other Ambulatory Visit: Payer: Self-pay

## 2019-12-19 MED ORDER — LEVOTHYROXINE SODIUM 88 MCG PO TABS: ORAL_TABLET | ORAL | 3 refills | Status: DC

## 2019-12-19 NOTE — Telephone Encounter (Signed)
Rx sent 

## 2020-02-25 ENCOUNTER — Telehealth: Payer: Self-pay

## 2020-02-25 NOTE — Telephone Encounter (Signed)
Phone call from patients wife [place] stating the patient has an appointment on 04/22/2020 @ 4pm with Dr. Denna Haggard.  Patient's wife wanted to know if Dr. Denna Haggard would remove the growth behind the patients ear at the same time? Or will Dr. Denna Haggard have to see the spot first?  Per Dr. Denna Haggard he hasn't seen the spot in 2 years so Dr. Denna Haggard will decide this at the time of the visit.  Patient's wife aware of Dr. Onalee Hua recommendation.

## 2020-03-24 ENCOUNTER — Other Ambulatory Visit: Payer: Self-pay

## 2020-03-24 ENCOUNTER — Encounter (HOSPITAL_COMMUNITY): Payer: Self-pay

## 2020-03-24 ENCOUNTER — Emergency Department (HOSPITAL_COMMUNITY)
Admission: EM | Admit: 2020-03-24 | Discharge: 2020-03-24 | Disposition: A | Discharge status: Home / Self Care | Payer: PPO | Attending: Emergency Medicine | Admitting: Emergency Medicine

## 2020-03-24 DIAGNOSIS — Z79899 Other long term (current) drug therapy: Secondary | ICD-10-CM | POA: Diagnosis not present

## 2020-03-24 DIAGNOSIS — R040 Epistaxis: Secondary | ICD-10-CM | POA: Insufficient documentation

## 2020-03-24 DIAGNOSIS — F17210 Nicotine dependence, cigarettes, uncomplicated: Secondary | ICD-10-CM | POA: Insufficient documentation

## 2020-03-24 DIAGNOSIS — E039 Hypothyroidism, unspecified: Secondary | ICD-10-CM | POA: Insufficient documentation

## 2020-03-24 MED ORDER — TRANEXAMIC ACID FOR EPISTAXIS
500.0000 mg | Freq: Once | TOPICAL | Status: DC
  Filled 2020-03-24 (×2): qty 10

## 2020-03-24 MED ORDER — OXYMETAZOLINE HCL 0.05 % NA SOLN
1.0000 | Freq: Once | NASAL | Status: AC
  Administered 2020-03-24: 1 via NASAL
  Filled 2020-03-24: qty 30

## 2020-03-24 NOTE — Discharge Instructions (Addendum)
Use the Afrin and apply pinching pressure to nose for 15 minutes if bleeding recurs. Return to the emergency department if bleeding is uncontrolled at home.

## 2020-03-24 NOTE — ED Provider Notes (Signed)
[place] DEPT Provider Note   CSN: [id number] Arrival date & time: 03/24/20  0305     History Chief Complaint  Patient presents with  . Epistaxis    Terry Miranda is a 69 y.o. male.  Patient to ED with c/o nosebleed that started one hour prior to arrival without injury. History of the same. Not anticoagulated. He reports the last time this occurred he required packing and subsequent cautery. No pain. He feels most of the bleeding is left sided but he has noticed some from the right.   The history is provided by the patient. No language interpreter was used.  Epistaxis Associated symptoms: no congestion and no headaches        Past Medical History:  Diagnosis Date  . Arthritis   . Atypical nevus 02/11/2008   Left Lower Deltoid-Slight  . Atypical nevus 08/26/2003   Upper Mid Right Back Sup-Slight to Moderate (w/s), Upper Mid Right Back Inf(Slight), Mid Back-Slight to Moderate(w/s), Mid Right Back Sup-Slight to Moderate(w/s), and Mid Right Back Inf-Slight to Moderate(w/s)  . Thyroid disease     Patient Active Problem List   Diagnosis Date Noted  . Hypothyroidism, postradioiodine therapy 02/27/2015  . Other postablative hypothyroidism 04/24/2014    Past Surgical History:  Procedure Laterality Date  . COLONOSCOPY    . HERNIA REPAIR         Family History  Problem Relation Age of Onset  . Thyroid cancer Brother   . Colon cancer Neg Hx   . Diabetes Neg Hx     Social History   Tobacco Use  . Smoking status: Current Every Day Smoker    Packs/day: 1.00    Types: Cigarettes  . Smokeless tobacco: Never Used  Substance Use Topics  . Alcohol use: Yes    Comment: rare  . Drug use: No    Home Medications Prior to Admission medications   Medication Sig Start Date End Date Taking? Authorizing Provider  acetaminophen (TYLENOL) 500 MG tablet Take 500 mg by mouth every 6 (six) hours as needed.    [provider]    cholecalciferol (VITAMIN D) 1000 UNITS tablet Take 2,000 Units by mouth daily. Two tablets once a day    [provider]  cyanocobalamin 1000 MCG tablet Take 1,000 mcg by mouth daily.    [provider]  levothyroxine (SYNTHROID) 88 MCG tablet TAKE 1 TABLET DAILY AND ON SUNDAYS TAKE ONE-HALF (1/2) TABLET 12/19/19   Elayne Snare, MD  tamsulosin (FLOMAX) 0.4 MG CAPS capsule TAKE 1 CAPSULE BY MOUTH EVERY DAY 12/19/19   McKenzie, Candee Furbish, MD    Allergies    Amoxicillin and Ibuprofen  Review of Systems   Review of Systems  Constitutional: Negative for fatigue.  HENT: Positive for nosebleeds. Negative for congestion.   Respiratory: Negative for shortness of breath.   Neurological: Negative for light-headedness and headaches.    Physical Exam Updated Vital Signs BP (!) 150/82 (BP Location: Left Arm)   Pulse 61   Temp 97.6 F (36.4 C) (Oral)   Resp 18   SpO2 98%   Physical Exam Vitals and nursing note reviewed.  HENT:     Head: Normocephalic.     Nose:     Comments: Active left epistaxis, anterior bleeding.    Mouth/Throat:     Mouth: Mucous membranes are moist.  Eyes:     Comments: No conjunctival pallor.  Pulmonary:     Effort: Pulmonary effort is normal.  Musculoskeletal:  General: Normal range of motion.  Skin:    General: Skin is warm and dry.  Neurological:     General: No focal deficit present.     Mental Status: He is alert.     ED Results / Procedures / Treatments   Labs (all labs ordered are listed, but only abnormal results are displayed) Labs Reviewed - No data to display  EKG None  Radiology No results found.  Procedures Procedures (including critical care time)  Medications Ordered in ED Medications  oxymetazoline (AFRIN) 0.05 % nasal spray 1 spray (has no administration in time range)  tranexamic acid (CYKLOKAPRON) 1000 MG/10ML topical solution 500 mg (has no administration in time range)    ED Course  I have reviewed  the triage vital signs and the nursing notes.  Pertinent labs & imaging results that were available during my care of the patient were reviewed by me and considered in my medical decision making (see chart for details).    MDM Rules/Calculators/A&P                      Patient to ED with steady nosebleed despite pressure. He reports similar symptoms in the past. He is not anticoagulated.   The patient is asked to blow his nose and inhale Afrin to nostrils bilaterally. Pinching pressure held. Will pack with TXA to clot off anterior bleed.   On recheck, there is no further bleeding after Afrin use and pressure. He is ambulated without recurrent bleeding. TXA not felt necessary. Discussed the possibility of further bleeding, use of Afrin, appropriate pressure. The patient is comfortable with discharge home without further intervention and will return with any worsening symptoms.   Final Clinical Impression(s) / ED Diagnoses Final diagnoses:  None   1. Epistaxis  Rx / DC Orders ED Discharge Orders    None       Dennie Bible 03/27/20 2248    Ripley Fraise, MD 03/27/20 2304

## 2020-03-24 NOTE — ED Provider Notes (Signed)
Patient with epistaxis, but not on anticoagulants.  Advised use of TXA to help with bleeding.  Patient stable in ER   Ripley Fraise, MD 03/24/20 [phone]

## 2020-03-24 NOTE — ED Triage Notes (Signed)
Patient arrived stating about an hour ago his nose started bleeding. Denies taking any blood thinners. Reports this happening in the past.

## 2020-03-26 DIAGNOSIS — N5201 Erectile dysfunction due to arterial insufficiency: Secondary | ICD-10-CM | POA: Diagnosis not present

## 2020-03-26 DIAGNOSIS — Z125 Encounter for screening for malignant neoplasm of prostate: Secondary | ICD-10-CM | POA: Diagnosis not present

## 2020-03-26 DIAGNOSIS — R351 Nocturia: Secondary | ICD-10-CM | POA: Diagnosis not present

## 2020-03-26 DIAGNOSIS — N401 Enlarged prostate with lower urinary tract symptoms: Secondary | ICD-10-CM | POA: Diagnosis not present

## 2020-04-22 ENCOUNTER — Other Ambulatory Visit: Payer: Self-pay

## 2020-04-22 ENCOUNTER — Ambulatory Visit: Payer: PPO | Admitting: Dermatology

## 2020-04-22 DIAGNOSIS — D229 Melanocytic nevi, unspecified: Secondary | ICD-10-CM

## 2020-04-22 DIAGNOSIS — L720 Epidermal cyst: Secondary | ICD-10-CM | POA: Diagnosis not present

## 2020-04-22 DIAGNOSIS — D1801 Hemangioma of skin and subcutaneous tissue: Secondary | ICD-10-CM

## 2020-04-22 DIAGNOSIS — R202 Paresthesia of skin: Secondary | ICD-10-CM | POA: Diagnosis not present

## 2020-04-22 DIAGNOSIS — D225 Melanocytic nevi of trunk: Secondary | ICD-10-CM | POA: Diagnosis not present

## 2020-04-22 DIAGNOSIS — L821 Other seborrheic keratosis: Secondary | ICD-10-CM

## 2020-04-22 DIAGNOSIS — L729 Follicular cyst of the skin and subcutaneous tissue, unspecified: Secondary | ICD-10-CM

## 2020-04-22 NOTE — Patient Instructions (Addendum)
Follow-up visit for Mr. Zao Masood.  His wife is concerned with an enlarging growth on the back of his right ear.  Examination showed a 1.5cm white superficial dermal nodule that represents a benign epidermoid cyst.  The risks should he choose to leave this would be enlargement or possible inflammation but not malignancy.  Should he choose to have it surgically removed, he will first contact his health plan to check on coverage.  The risk of removal would be a small scar or possible recurrence.  Should Mr. Duvernay wants this removed he will schedule 30 minutes.  The rest of his skin examination from the waist up showed no atypical moles or nonmole skin cancer.  He has multiple benign vascular growths including tiny red dots on the torso especially the back that are benign angiomata and a blue bubble on the left central lower lip that is a venous lake.  There are half dozen textured brown elevations mostly 5 to 7 mm on the back that are benign keratoses.  There is a 6 mm cutaneous horn on the right lower deltoid that could represent either a wart or a slightly inflamed keratosis.  Mr. Dolloff is not interested in removing this unless there is clinical change.  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  Should this get worse, he will look for a nonprescription lotion containing an ingredient called pramoxine.  This can be found in [employer] antiitch or CeraVe itch relief or a dozen other nonprescription products.  He may apply this as much as he wants if it helps relieve the itching. follow-up will be on an as-needed basis or whenever his wife see something that concerns her.

## 2020-04-24 ENCOUNTER — Encounter: Payer: Self-pay | Admitting: Dermatology

## 2020-04-24 NOTE — Progress Notes (Signed)
   Follow-Up Visit   Subjective  MCKYLE TORTORA is a 69 y.o. male who presents for the following: Skin Problem (Check place on right ear. Been there a couple of years. No bleed or pain. Wife wanted it checked. Check right arm brown kinda crusty spot. ).  growth Location: Behind l right ear  Duration: 1 year Quality: Larger Associated Signs/Symptoms: Modifying Factors:  Severity:  Timing: Context: Worries his wife  The following portions of the chart were reviewed this encounter and updated as appropriate: Tobacco  Allergies  Meds  Problems  Med Hx  Surg Hx  Fam Hx      Objective  Well appearing patient in no apparent distress; mood and affect are within normal limits.  A full examination was performed including scalp, head, eyes, ears, nose, lips, neck, chest, axillae, abdomen, back, buttocks, bilateral upper extremities, bilateral lower extremities, hands, feet, fingers, toes, fingernails, and toenails. All findings within normal limits unless otherwise noted below.   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 Carrson Burge.  His wife is concerned with an enlarging growth on the back of his right ear.  Examination showed a 1.5cm white superficial dermal nodule that represents a benign epidermoid cyst.  The risks should he choose to leave this would be enlargement or possible inflammation but not malignancy.  Should he choose to have it surgically removed, he will first contact his health plan to check on coverage.  The risk of removal would be a small scar or possible recurrence.  Should Mr. Rinne wants this removed he will schedule 30 minutes.  The rest of his skin examination from the waist up showed no atypical moles or nonmole skin  cancer.  He has multiple benign vascular growths including tiny red dots on the torso especially the back that are benign angiomata and a blue bubble on the left central lower lip that is a venous lake.  There are half dozen textured brown elevations mostly 5 to 7 mm on the back that are benign keratoses.  There is a 6 mm cutaneous horn on the right lower deltoid that could represent either a wart or a slightly inflamed keratosis.  Mr. Kable is not interested in removing this unless there is clinical change.  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  Should this get worse, he will look for a nonprescription lotion containing an ingredient called pramoxine.  This can be found in [employer] antiitch or CeraVe itch relief or a dozen other nonprescription products.  He may apply this as much as he wants if it helps relieve the itching. follow-up will be on an as-needed basis or whenever his wife see something that concerns her.

## 2020-11-27 ENCOUNTER — Other Ambulatory Visit (INDEPENDENT_AMBULATORY_CARE_PROVIDER_SITE_OTHER): Payer: PPO

## 2020-11-27 ENCOUNTER — Other Ambulatory Visit: Payer: Self-pay

## 2020-11-27 DIAGNOSIS — E890 Postprocedural hypothyroidism: Secondary | ICD-10-CM

## 2020-11-27 LAB — T4, FREE: Free T4: 0.85 ng/dL (ref 0.60–1.60)

## 2020-11-27 LAB — TSH: TSH: 4.17 u[IU]/mL (ref 0.35–4.50)

## 2020-12-02 NOTE — Progress Notes (Signed)
Patient ID: Terry Miranda, male   DOB: [birth year], 69 y.o.   MRN: [id number]    Reason for Appointment:  Post ablative hypothyroidism  followup visit   History of Present Illness:   Hyperthyroidism was first diagnosed in 11/2012 Initial symptoms at diagnosis were shakiness, some fatigue and feeling warm. Also had lost 20 pounds  Treatment in 12/2012 was with 5 mg methimazole twice a day. He had good results with this and the medication was tapered off and stopped in 5/14. Because of relapsing hyperthyroidism in 6/14 with minimal symptoms  he was started back on methimazole 5 mg daily Subsequently with stopping his medication in 11/14 he relapsed with his hyperthyroidism and was restarted on methimazole in 12/14 Since he was not able to wean off his methimazole again he was given 15.9 mCi of I-131 on 03/20/14  He developed  hypothyroidism and was started on Synthroid 112 mcg on 04/24/14 This dose was reduced in 6/15 and is since then taking 88 g daily  He has been taking 6-1/2 tablets a week of the 88 mcg generic Synthroid when he had a relatively low TSH in 12/18 of 0.68 Subsequently has had consistently normal TSH level around 2-3 but now it is 4.2  He feels fairly good with no change in energy level, he does tend to fall asleep easily if sitting but this is not new No change in mild cold intolerance His weight is about 4 pounds more No change in skin   He is consistent with his levothyroxine with water 30 minutes before breakfast daily and has not missed any doses  TSH is normal at 2.1       Wt Readings from Last 3 Encounters:  12/03/20 194 lb 3.2 oz (88.1 kg)  12/04/19 189 lb 12.8 oz (86.1 kg)  12/07/18 186 lb (84.4 kg)   LABS:  Lab Results  Component Value Date   FREET4 0.85 11/27/2020   FREET4 0.94 11/29/2019   FREET4 0.77 11/30/2018   FREET4 1.01 11/27/2017   TSH 4.17 11/27/2020   TSH 2.06 11/29/2019   TSH 2.74 11/30/2018   TSH 0.68 11/27/2017     Allergies as of 12/03/2020      Reactions   Amoxicillin    REACTION: face swelled   Ibuprofen Swelling   Face swell      Medication List       Accurate as of December 03, 2020  8:18 AM. If you have any questions, ask your nurse or doctor.        acetaminophen 500 MG tablet Commonly known as: TYLENOL Take 500 mg by mouth every 6 (six) hours as needed.   cholecalciferol 1000 units tablet Commonly known as: VITAMIN D Take 2,000 Units by mouth daily. Two tablets once a day   cyanocobalamin 1000 MCG tablet Take 1,000 mcg by mouth daily.   levothyroxine 88 MCG tablet Commonly known as: SYNTHROID TAKE 1 TABLET DAILY AND ON SUNDAYS TAKE ONE-HALF (1/2) TABLET   tamsulosin 0.4 MG Caps capsule Commonly known as: FLOMAX TAKE 1 CAPSULE BY MOUTH EVERY DAY           Past Medical History:  Diagnosis Date  . Arthritis   . Atypical nevus 02/11/2008   Left Lower Deltoid-Slight  . Atypical nevus 08/26/2003   Upper Mid Right Back Sup-Slight to Moderate (w/s), Upper Mid Right Back Inf(Slight), Mid Back-Slight to Moderate(w/s), Mid Right Back Sup-Slight to Moderate(w/s), and Mid Right Back Inf-Slight to Moderate(w/s)  . Thyroid  disease     Past Surgical History:  Procedure Laterality Date  . COLONOSCOPY    . HERNIA REPAIR      Family History  Problem Relation Age of Onset  . Thyroid cancer Brother   . Colon cancer Neg Hx   . Diabetes Neg Hx     Social History:  reports that he has been smoking cigarettes. He has been smoking about 1.00 pack per day. He has never used smokeless tobacco. He reports current alcohol use. He reports that he does not use drugs.  Allergies:  Allergies  Allergen Reactions  . Amoxicillin     REACTION: face swelled  . Ibuprofen Swelling    Face swell    Review of Systems:  Has history of low pulse rate and cardiac murmur, had sinus bradycardia on his EKG previously in 2015    Has previous history of hypercholesterolemia but has not  followed up with his PCP        Examination:   BP 128/72   Pulse 60   Ht 5\' 9"  (1.753 m)   Wt 194 lb 3.2 oz (88.1 kg)   SpO2 97%   BMI 28.68 kg/m   Biceps reflexes show relatively normal relaxation No dry skin  No peripheral edema   Assessment/Plan:  Post ablative hypothyroidism:  He is on a consistent regimen with 88 mcg generic levothyroxine, taking 6-1/2 tablets a week No unusual fatigue recently However TSH is trending higher and now is 4.2 Has not missed any doses of his levothyroxine and is taking this without any interacting vitamins  For now we will have him take 7 pills a week instead of 6-1/2 Follow-up in 6 months Recommended that he set up a physical with his PCP Influenza vaccine given   12/03/2020, 8:18 AM

## 2020-12-03 ENCOUNTER — Other Ambulatory Visit: Payer: Self-pay

## 2020-12-03 ENCOUNTER — Encounter: Payer: Self-pay | Admitting: Endocrinology

## 2020-12-03 ENCOUNTER — Ambulatory Visit: Payer: PPO | Admitting: Endocrinology

## 2020-12-03 VITALS — BP 128/72 | HR 60 | Ht 69.0 in | Wt 194.2 lb

## 2020-12-03 DIAGNOSIS — Z23 Encounter for immunization: Secondary | ICD-10-CM

## 2020-12-03 DIAGNOSIS — E890 Postprocedural hypothyroidism: Secondary | ICD-10-CM

## 2020-12-03 DIAGNOSIS — E7800 Pure hypercholesterolemia, unspecified: Secondary | ICD-10-CM

## 2020-12-03 MED ORDER — LEVOTHYROXINE SODIUM 88 MCG PO TABS: ORAL_TABLET | ORAL | 1 refills | Status: DC

## 2021-02-05 ENCOUNTER — Other Ambulatory Visit: Payer: Self-pay | Admitting: Endocrinology

## 2021-03-01 ENCOUNTER — Telehealth: Payer: Self-pay | Admitting: Endocrinology

## 2021-03-01 NOTE — Telephone Encounter (Signed)
Pt's daughter called on behalf of pt. She is worried because he has developed a swollen area on the right side of his head in front of his ear and really wants to get him looked at ASAP. She wanted to book him an appt ASAP however I advised her Dr.Kumar is going on vacation and so she would just like to get Dr.Kumar's advise on what is going on and if he should come in.  Callback # [place] [phone]

## 2021-03-02 NOTE — Telephone Encounter (Signed)
Please advise 

## 2021-03-02 NOTE — Telephone Encounter (Signed)
This is not a thyroid problem, he needs to see his PCP

## 2021-03-03 NOTE — Telephone Encounter (Signed)
Called and left message for [place] to call PCP.

## 2021-03-03 NOTE — Telephone Encounter (Signed)
Pt's daughter called the office back, I relayed the information below. Pt's daughter verbalized understanding and states they have an upcoming PCP appt in the beginning of April she will speak to him about it then.

## 2021-03-24 DIAGNOSIS — Z23 Encounter for immunization: Secondary | ICD-10-CM | POA: Diagnosis not present

## 2021-03-24 DIAGNOSIS — Z122 Encounter for screening for malignant neoplasm of respiratory organs: Secondary | ICD-10-CM | POA: Diagnosis not present

## 2021-03-24 DIAGNOSIS — R001 Bradycardia, unspecified: Secondary | ICD-10-CM | POA: Diagnosis not present

## 2021-03-24 DIAGNOSIS — R011 Cardiac murmur, unspecified: Secondary | ICD-10-CM | POA: Diagnosis not present

## 2021-03-25 ENCOUNTER — Other Ambulatory Visit: Payer: Self-pay | Admitting: *Deleted

## 2021-03-25 DIAGNOSIS — F17210 Nicotine dependence, cigarettes, uncomplicated: Secondary | ICD-10-CM

## 2021-03-25 DIAGNOSIS — Z87891 Personal history of nicotine dependence: Secondary | ICD-10-CM

## 2021-04-22 DIAGNOSIS — N5201 Erectile dysfunction due to arterial insufficiency: Secondary | ICD-10-CM | POA: Diagnosis not present

## 2021-04-22 DIAGNOSIS — N401 Enlarged prostate with lower urinary tract symptoms: Secondary | ICD-10-CM | POA: Diagnosis not present

## 2021-04-22 DIAGNOSIS — Z125 Encounter for screening for malignant neoplasm of prostate: Secondary | ICD-10-CM | POA: Diagnosis not present

## 2021-04-22 DIAGNOSIS — R351 Nocturia: Secondary | ICD-10-CM | POA: Diagnosis not present

## 2021-04-26 ENCOUNTER — Ambulatory Visit (INDEPENDENT_AMBULATORY_CARE_PROVIDER_SITE_OTHER): Payer: PPO | Admitting: Acute Care

## 2021-04-26 ENCOUNTER — Other Ambulatory Visit: Payer: Self-pay

## 2021-04-26 ENCOUNTER — Encounter: Payer: Self-pay | Admitting: Acute Care

## 2021-04-26 ENCOUNTER — Ambulatory Visit (INDEPENDENT_AMBULATORY_CARE_PROVIDER_SITE_OTHER)
Admission: RE | Admit: 2021-04-26 | Discharge: 2021-04-26 | Disposition: A | Discharge status: Home / Self Care | Payer: PPO | Source: Ambulatory Visit | Attending: Interventional Cardiology | Admitting: Interventional Cardiology

## 2021-04-26 VITALS — BP 118/74 | HR 85 | Temp 97.1°F | Ht 70.0 in | Wt 193.4 lb

## 2021-04-26 DIAGNOSIS — Z87891 Personal history of nicotine dependence: Secondary | ICD-10-CM | POA: Diagnosis not present

## 2021-04-26 DIAGNOSIS — F17210 Nicotine dependence, cigarettes, uncomplicated: Secondary | ICD-10-CM

## 2021-04-26 NOTE — Patient Instructions (Signed)
Thank you for participating in the [facility]. It was our pleasure to meet you today. We will call you with the results of your scan within the next few days. Your scan will be assigned a Lung RADS category score by the physicians reading the scans.  This Lung RADS score determines follow up scanning.  See below for description of categories, and follow up screening recommendations. We will be in touch to schedule your follow up screening annually or based on recommendations of our providers. We will fax a copy of your scan results to your Primary Care Physician, or the physician who referred you to the program, to ensure they have the results. Please call the office if you have any questions or concerns regarding your scanning experience or results.  Our office number is [phone]. Please speak with Denise Phelps, RN. She is our Lung Cancer Screening RN. If she is unavailable when you call, please have the office staff send her a message. She will return your call at her earliest convenience. Remember, if your scan is normal, we will scan you annually as long as you continue to meet the criteria for the program. (Age 55-77, Current smoker or smoker who has quit within the last 15 years). If you are a smoker, remember, quitting is the single most powerful action that you can take to decrease your risk of lung cancer and other pulmonary, breathing related problems. We know quitting is hard, and we are here to help.  Please let us know if there is anything we can do to help you meet your goal of quitting. If you are a former smoker, congratulations. We are proud of you! Remain smoke free! Remember you can refer friends or family members through the number above.  We will screen them to make sure they meet criteria for the program. Thank you for helping us take better care of you by participating in Lung Screening.  Lung RADS Categories:  Lung RADS 1: no nodules  or definitely non-concerning nodules.  Recommendation is for a repeat annual scan in 12 months.  Lung RADS 2:  nodules that are non-concerning in appearance and behavior with a very low likelihood of becoming an active cancer. Recommendation is for a repeat annual scan in 12 months.  Lung RADS 3: nodules that are probably non-concerning , includes nodules with a low likelihood of becoming an active cancer.  Recommendation is for a 6-month repeat screening scan. Often noted after an upper respiratory illness. We will be in touch to make sure you have no questions, and to schedule your 6-month scan.  Lung RADS 4 A: nodules with concerning findings, recommendation is most often for a follow up scan in 3 months or additional testing based on our provider's assessment of the scan. We will be in touch to make sure you have no questions and to schedule the recommended 3 month follow up scan.  Lung RADS 4 B:  indicates findings that are concerning. We will be in touch with you to schedule additional diagnostic testing based on our provider's  assessment of the scan.   

## 2021-04-26 NOTE — Progress Notes (Signed)
Shared Decision Making Visit Lung Cancer Screening Program [phone])   Eligibility:  Age 70 y.o.  Pack Years Smoking History Calculation 39 pack year smoking history (# packs/per year x # years smoked)  Recent History of coughing up blood  no  Unexplained weight loss? no ( >Than 15 pounds within the last 6 months )  Prior History Lung / other cancer no (Diagnosis within the last 5 years already requiring surveillance chest CT Scans).  Smoking Status Current Smoker  Former Smokers: Years since quit: NA  Quit Date: NA  Visit Components:  Discussion included one or more decision making aids. yes  Discussion included risk/benefits of screening. yes  Discussion included potential follow up diagnostic testing for abnormal scans. yes  Discussion included meaning and risk of over diagnosis. yes  Discussion included meaning and risk of False Positives. yes  Discussion included meaning of total radiation exposure. yes  Counseling Included:  Importance of adherence to annual lung cancer LDCT screening. yes  Impact of comorbidities on ability to participate in the program. yes  Ability and willingness to under diagnostic treatment. yes  Smoking Cessation Counseling:  Current Smokers:   Discussed importance of smoking cessation. yes  Information about tobacco cessation classes and interventions provided to patient. yes  Patient provided with "ticket" for LDCT Scan. yes  Symptomatic Patient. no  Counseling NA  Diagnosis Code: Tobacco Use Z72.0  Asymptomatic Patient yes  Counseling (Intermediate counseling: > three minutes counseling) G8366  Former Smokers:   Discussed the importance of maintaining cigarette abstinence. yes  Diagnosis Code: Personal History of Nicotine Dependence. Q94.765  Information about tobacco cessation classes and interventions provided to patient. Yes  Patient provided with "ticket" for LDCT Scan. yes  Written Order for Lung Cancer  Screening with LDCT placed in Epic. Yes (CT Chest Lung Cancer Screening Low Dose W/O CM) [id number] Z12.2-Screening of respiratory organs Z87.891-Personal history of nicotine dependence  I have spent 25 minutes of face to face time with Terry Miranda discussing the risks and benefits of lung cancer screening. We viewed a power point together that explained in detail the above noted topics. We paused at intervals to allow for questions to be asked and answered to ensure understanding.We discussed that the single most powerful action that he can take to decrease his risk of developing lung cancer is to quit smoking. We discussed whether or not he is ready to commit to setting a quit date. We discussed options for tools to aid in quitting smoking including nicotine replacement therapy, non-nicotine medications, support groups, Quit Smart classes, and behavior modification. We discussed that often times setting smaller, more achievable goals, such as eliminating 1 cigarette a day for a week and then 2 cigarettes a day for a week can be helpful in slowly decreasing the number of cigarettes smoked. This allows for a sense of accomplishment as well as providing a clinical benefit. I gave him the " Be Stronger Than Your Excuses" card with contact information for community resources, classes, free nicotine replacement therapy, and access to mobile apps, text messaging, and on-line smoking cessation help. I have also given him my card and contact information in the event he needs to contact me. We discussed the time and location of the scan, and that either Doroteo Glassman RN or I will call with the results within 24-48 hours of receiving them. I have offered him  a copy of the power point we viewed  as a resource in the event they need reinforcement  of the concepts we discussed today in the office. The patient verbalized understanding of all of  the above and had no further questions upon leaving the office. They have my contact  information in the event they have any further questions.  I spent 4 minutes counseling on smoking cessation and the health risks of continued tobacco abuse. He has tried multiple methods to help him quit without any luck. I have told him to let me know if he would like to try acupuncture or hypnosis, as other patient's have been successful  quitting  with these techniques.   I explained to the patient that there has been a high incidence of coronary artery disease noted on these exams. I explained that this is a non-gated exam therefore degree or severity cannot be determined. This patient is not on statin therapy. I have asked the patient to follow-up with their PCP regarding any incidental finding of coronary artery disease and management with diet or medication as their PCP  feels is clinically indicated. The patient verbalized understanding of the above and had no further questions upon completion of the visit.      Terry Spatz, NP 04/26/2021

## 2021-04-27 ENCOUNTER — Ambulatory Visit: Payer: PPO | Admitting: Cardiovascular Disease

## 2021-04-27 ENCOUNTER — Encounter: Payer: Self-pay | Admitting: Cardiovascular Disease

## 2021-04-27 DIAGNOSIS — R001 Bradycardia, unspecified: Secondary | ICD-10-CM | POA: Diagnosis not present

## 2021-04-27 DIAGNOSIS — R011 Cardiac murmur, unspecified: Secondary | ICD-10-CM | POA: Diagnosis not present

## 2021-04-27 NOTE — Assessment & Plan Note (Signed)
Patient has soft outflow tract murmur.  We will check a 2D echocardiogram.

## 2021-04-27 NOTE — Progress Notes (Signed)
04/27/2021 Terry Miranda   05-Aug-1951  [id number]  Primary Physician Terry Dus, MD Primary Cardiologist: Lorretta Harp MD Terry Miranda, [place]  HPI:  Terry Miranda is a 70 y.o. thin appearing married Caucasian male father of 1 child with one grandchild on the way who is wife Terry Miranda is also a patient of mine.  He was referred by his primary care provider, and at that [facility], for evaluation of asymptomatic bradycardia.  He was found to have a "slow heart rate" by his occupational nurse at work.  Does have a heart rate in the 50s.  Has been told that he has a cardiac murmur.  His risk factors include 50 pack years of tobacco abuse currently smoking 1 pack/day recalcitrant to risk factor modification.  He has no other cardiac risk factors.  Is never had a heart attack or stroke.  He denies chest pain or shortness of breath.  He does walk at work but does note organized exercise.   Current Meds  Medication Sig  . acetaminophen (TYLENOL) 500 MG tablet Take 500 mg by mouth every 6 (six) hours as needed.  . cholecalciferol (VITAMIN D) 1000 UNITS tablet Take 2,000 Units by mouth daily. Two tablets once a day  . levothyroxine (SYNTHROID) 88 MCG tablet TAKE 1 TABLET DAILY AND ON SUNDAYS TAKE ONE-HALF (1/2) TABLET  . tamsulosin (FLOMAX) 0.4 MG CAPS capsule TAKE 1 CAPSULE BY MOUTH EVERY DAY     Allergies  Allergen Reactions  . Amoxicillin     REACTION: face swelled  . Fish Oil Other (See Comments)  . Ibuprofen Swelling    Face swell  . Saw Palmetto Other (See Comments)    Social History   Socioeconomic History  . Marital status: Married    Spouse name: Not on file  . Number of children: Not on file  . Years of education: Not on file  . Highest education level: Not on file  Occupational History  . Not on file  Tobacco Use  . Smoking status: Current Every Day Smoker    Packs/day: 1.00    Types: Cigarettes  . Smokeless tobacco: Never Used  Substance and Sexual Activity   . Alcohol use: Yes    Comment: rare  . Drug use: No  . Sexual activity: Not on file  Other Topics Concern  . Not on file  Social History Narrative  . Not on file   Social Determinants of Health   Financial Resource Strain: Not on file  Food Insecurity: Not on file  Transportation Needs: Not on file  Physical Activity: Not on file  Stress: Not on file  Social Connections: Not on file  Intimate Partner Violence: Not on file     Review of Systems: General: negative for chills, fever, night sweats or weight changes.  Cardiovascular: negative for chest pain, dyspnea on exertion, edema, orthopnea, palpitations, paroxysmal nocturnal dyspnea or shortness of breath Dermatological: negative for rash Respiratory: negative for cough or wheezing Urologic: negative for hematuria Abdominal: negative for nausea, vomiting, diarrhea, bright red blood per rectum, melena, or hematemesis Neurologic: negative for visual changes, syncope, or dizziness All other systems reviewed and are otherwise negative except as noted above.    Blood pressure 118/82, pulse (!) 53, height 5\' 10"  (1.778 m), weight 192 lb (87.1 kg), SpO2 96 %.  General appearance: alert and no distress Neck: no adenopathy, no JVD, supple, symmetrical, trachea midline, thyroid not enlarged, symmetric, no tenderness/mass/nodules and Soft left  carotid bruit Lungs: clear to auscultation bilaterally Heart: Soft outflow tract murmur Extremities: extremities normal, atraumatic, no cyanosis or edema Pulses: 2+ and symmetric Skin: Skin color, texture, turgor normal. No rashes or lesions Neurologic: Alert and oriented X 3, normal strength and tone. Normal symmetric reflexes. Normal coordination and gait  EKG sinus bradycardia at 53 with sinus arrhythmia.  I personally reviewed this EKG.  ASSESSMENT AND PLAN:   Slow heart rate Terry Miranda was referred to me by his primary care provider, and Terry Miranda, for asymptomatic bradycardia.  He  has heart rate in the 50s.  He has no symptoms from this.  His rhythm is sinus with sinus arrhythmia.  I do not feel need to further evaluate this at this time.  Cardiac murmur Patient has soft outflow tract murmur.  We will check a 2D echocardiogram.      Lorretta Harp MD [facility], [facility] 04/27/2021 4:20 PM

## 2021-04-27 NOTE — Patient Instructions (Signed)
Medication Instructions:  Your physician recommends that you continue on your current medications as directed. Please refer to the Current Medication list given to you today.  *If you need a refill on your cardiac medications before your next appointment, please call your pharmacy*   Testing/Procedures: Your physician has requested that you have an echocardiogram. Echocardiography is a painless test that uses sound waves to create images of your heart. It provides your doctor with information about the size and shape of your heart and how well your heart's chambers and valves are working. This procedure takes approximately one hour. There are no restrictions for this procedure. This procedure is done at [address] physician has requested that you have a carotid duplex. This test is an ultrasound of the carotid arteries in your neck. It looks at blood flow through these arteries that supply the brain with blood. Allow one hour for this exam. There are no restrictions or special instructions. This procedure is done at [place]. 2nd Floor  Dr. Gwenlyn Found has ordered a CT coronary calcium score. This test is done at [address] 3[address]. This is $99 out of pocket.   Coronary CalciumScan A coronary calcium scan is an imaging test used to look for deposits of calcium and other fatty materials (plaques) in the inner lining of the blood vessels of the heart (coronary arteries). These deposits of calcium and plaques can partly clog and narrow the coronary arteries without producing any symptoms or warning signs. This puts a person at risk for a heart attack. This test can detect these deposits before symptoms develop. Tell a health care provider about:  Any allergies you have.  All medicines you are taking, including vitamins, herbs, eye drops, creams, and over-the-counter medicines.  Any problems you or family members have had with anesthetic medicines.  Any  blood disorders you have.  Any surgeries you have had.  Any medical conditions you have.  Whether you are pregnant or may be pregnant. What are the risks? Generally, this is a safe procedure. However, problems may occur, including:  Harm to a pregnant woman and her unborn baby. This test involves the use of radiation. Radiation exposure can be dangerous to a pregnant woman and her unborn baby. If you are pregnant, you generally should not have this procedure done.  Slight increase in the risk of cancer. This is because of the radiation involved in the test. What happens before the procedure? No preparation is needed for this procedure. What happens during the procedure?  You will undress and remove any jewelry around your neck or chest.  You will put on a hospital gown.  Sticky electrodes will be placed on your chest. The electrodes will be connected to an electrocardiogram (ECG) machine to record a tracing of the electrical activity of your heart.  A CT scanner will take pictures of your heart. During this time, you will be asked to lie still and hold your breath for 2-3 seconds while a picture of your heart is being taken. The procedure may vary among health care providers and hospitals. What happens after the procedure?  You can get dressed.  You can return to your normal activities.  It is up to you to get the results of your test. Ask your health care provider, or the department that is doing the test, when your results will be ready. Summary  A coronary calcium scan is an imaging test used to  look for deposits of calcium and other fatty materials (plaques) in the inner lining of the blood vessels of the heart (coronary arteries).  Generally, this is a safe procedure. Tell your health care provider if you are pregnant or may be pregnant.  No preparation is needed for this procedure.  A CT scanner will take pictures of your heart.  You can return to your normal  activities after the scan is done. This information is not intended to replace advice given to you by your health care provider. Make sure you discuss any questions you have with your health care provider. Document Released: 05/26/2008 Document Revised: 10/17/2016 Document Reviewed: 10/17/2016 Elsevier Interactive Patient Education  2017 [place]: At [facility], you and your health needs are our priority.  As part of our continuing mission to provide you with exceptional heart care, we have created designated Provider Care Teams.  These Care Teams include your primary Cardiologist (physician) and Advanced Practice Providers (APPs -  Physician Assistants and Nurse Practitioners) who all work together to provide you with the care you need, when you need it.  We recommend signing up for the patient portal called "MyChart".  Sign up information is provided on this After Visit Summary.  MyChart is used to connect with patients for Virtual Visits (Telemedicine).  Patients are able to view lab/test results, encounter notes, upcoming appointments, etc.  Non-urgent messages can be sent to your provider as well.   To learn more about what you can do with MyChart, go to [email or web address].    Your next appointment:   2 month(s)  The format for your next appointment:   In Person  Provider:   Quay Burow, MD

## 2021-04-27 NOTE — Assessment & Plan Note (Signed)
Mr. Peloquin was referred to me by his primary care provider, and Anastasio Auerbach, for asymptomatic bradycardia.  He has heart rate in the 50s.  He has no symptoms from this.  His rhythm is sinus with sinus arrhythmia.  I do not feel need to further evaluate this at this time.

## 2021-04-30 NOTE — Progress Notes (Signed)
Please call patient and let them  know their  low dose Ct was read as a Lung RADS 2: nodules that are benign in appearance and behavior with a very low likelihood of becoming a clinically active cancer due to size or lack of growth. Recommendation per radiology is for a repeat LDCT in 12 months. .Please let them  know we will order and schedule their  annual screening scan for 04/2022. Please let them  know there was notation of CAD on their  scan.  Please remind the patient  that this is a non-gated exam therefore degree or severity of disease  cannot be determined. Please have them  follow up with their PCP regarding potential risk factor modification, dietary therapy or pharmacologic therapy if clinically indicated. Pt.  is not  currently on statin therapy. Please place order for annual  screening scan for  04/2022 and fax results to PCP. Thanks so much.  Please have patient follow up with PCP about cardiovascular findings. Thanks

## 2021-05-04 ENCOUNTER — Other Ambulatory Visit: Payer: Self-pay | Admitting: Cardiovascular Disease

## 2021-05-04 ENCOUNTER — Other Ambulatory Visit: Payer: Self-pay

## 2021-05-04 ENCOUNTER — Ambulatory Visit (HOSPITAL_COMMUNITY)
Admission: RE | Admit: 2021-05-04 | Discharge: 2021-05-04 | Disposition: A | Discharge status: Home / Self Care | Payer: PPO | Source: Ambulatory Visit | Attending: Cardiovascular Disease | Admitting: Cardiovascular Disease

## 2021-05-04 DIAGNOSIS — R011 Cardiac murmur, unspecified: Secondary | ICD-10-CM | POA: Insufficient documentation

## 2021-05-04 DIAGNOSIS — R0989 Other specified symptoms and signs involving the circulatory and respiratory systems: Secondary | ICD-10-CM | POA: Diagnosis not present

## 2021-05-04 DIAGNOSIS — R001 Bradycardia, unspecified: Secondary | ICD-10-CM | POA: Insufficient documentation

## 2021-05-06 ENCOUNTER — Encounter: Payer: Self-pay | Admitting: *Deleted

## 2021-05-06 DIAGNOSIS — Z87891 Personal history of nicotine dependence: Secondary | ICD-10-CM

## 2021-05-06 DIAGNOSIS — F17210 Nicotine dependence, cigarettes, uncomplicated: Secondary | ICD-10-CM

## 2021-05-28 ENCOUNTER — Other Ambulatory Visit (INDEPENDENT_AMBULATORY_CARE_PROVIDER_SITE_OTHER): Payer: PPO

## 2021-05-28 ENCOUNTER — Other Ambulatory Visit: Payer: Self-pay

## 2021-05-28 ENCOUNTER — Other Ambulatory Visit (HOSPITAL_COMMUNITY): Payer: PPO

## 2021-05-28 ENCOUNTER — Other Ambulatory Visit: Payer: PPO

## 2021-05-28 DIAGNOSIS — E890 Postprocedural hypothyroidism: Secondary | ICD-10-CM | POA: Diagnosis not present

## 2021-05-28 DIAGNOSIS — E7800 Pure hypercholesterolemia, unspecified: Secondary | ICD-10-CM

## 2021-05-28 LAB — T4, FREE: Free T4: 0.89 ng/dL (ref 0.60–1.60)

## 2021-05-28 LAB — LIPID PANEL
Cholesterol: 193 mg/dL (ref 0–200)
HDL: 40.1 mg/dL (ref >39.00)
LDL Cholesterol: 130 mg/dL — ABNORMAL HIGH (ref 0–99)
NonHDL: 152.55
Total CHOL/HDL Ratio: 5
Triglycerides: 111 mg/dL (ref 0.0–149.0)
VLDL: 22.2 mg/dL (ref 0.0–40.0)

## 2021-05-28 LAB — TSH: TSH: 3.69 u[IU]/mL (ref 0.35–4.50)

## 2021-05-31 ENCOUNTER — Other Ambulatory Visit: Payer: PPO

## 2021-05-31 ENCOUNTER — Other Ambulatory Visit: Payer: Self-pay

## 2021-05-31 ENCOUNTER — Ambulatory Visit (HOSPITAL_COMMUNITY): Payer: PPO | Attending: Cardiology

## 2021-05-31 ENCOUNTER — Ambulatory Visit (INDEPENDENT_AMBULATORY_CARE_PROVIDER_SITE_OTHER)
Admission: RE | Admit: 2021-05-31 | Discharge: 2021-05-31 | Disposition: A | Discharge status: Home / Self Care | Payer: Self-pay | Source: Ambulatory Visit | Attending: Cardiovascular Disease | Admitting: Cardiovascular Disease

## 2021-05-31 DIAGNOSIS — R001 Bradycardia, unspecified: Secondary | ICD-10-CM

## 2021-05-31 DIAGNOSIS — R011 Cardiac murmur, unspecified: Secondary | ICD-10-CM

## 2021-06-01 LAB — ECHOCARDIOGRAM COMPLETE
Area-P 1/2: 2.22 cm2
MV M vel: 5.68 m/s
MV Peak grad: 129 mmHg
P 1/2 time: 784 msec
Radius: 0.8 cm
S' Lateral: 3.6 cm

## 2021-06-03 ENCOUNTER — Other Ambulatory Visit: Payer: Self-pay

## 2021-06-03 ENCOUNTER — Ambulatory Visit (INDEPENDENT_AMBULATORY_CARE_PROVIDER_SITE_OTHER): Payer: PPO | Admitting: Endocrinology

## 2021-06-03 ENCOUNTER — Encounter: Payer: Self-pay | Admitting: Endocrinology

## 2021-06-03 ENCOUNTER — Telehealth: Payer: Self-pay | Admitting: Cardiovascular Disease

## 2021-06-03 VITALS — BP 124/82 | HR 60 | Ht 70.0 in | Wt 192.0 lb

## 2021-06-03 DIAGNOSIS — E890 Postprocedural hypothyroidism: Secondary | ICD-10-CM

## 2021-06-03 DIAGNOSIS — E7800 Pure hypercholesterolemia, unspecified: Secondary | ICD-10-CM | POA: Diagnosis not present

## 2021-06-03 NOTE — Telephone Encounter (Signed)
Pt returning phone call for results.

## 2021-06-03 NOTE — Telephone Encounter (Signed)
Patient returning call for CT results. He states today the best number is his work phone: [phone].

## 2021-06-03 NOTE — Progress Notes (Signed)
Patient ID: Terry Miranda, male   DOB: [birth year], 70 y.o.   MRN: [id number]    Reason for Appointment:  Post ablative hypothyroidism  followup visit   History of Present Illness:   Hyperthyroidism was first diagnosed in 11/2012 Initial symptoms at diagnosis were shakiness, some fatigue and feeling warm. Also had lost 20 pounds  Treatment in 12/2012 was with 5 mg methimazole twice a day. He had good results with this and the medication was tapered off and stopped in 5/14. Because of relapsing hyperthyroidism in 6/14 with minimal symptoms  he was started back on methimazole 5 mg daily Subsequently with stopping his medication in 11/14 he relapsed with his hyperthyroidism and was restarted on methimazole in 12/14 Since he was not able to wean off his methimazole again he was given 15.9 mCi of I-131 on 03/20/14  He developed  hypothyroidism and was started on Synthroid 112 mcg on 04/24/14 This dose was reduced in 6/15 and is since then taking 88 g daily  He had been taking 6-1/2 tablets a week of the 88 mcg generic Synthroid but since 12/21 has been taking 1 tablet daily Did not feel any different on the dosage change  He feels fairly good with no change in energy level, he does tend to fall asleep easily if sitting as before Weight is stable  He is consistent with taking his levothyroxine 30 minutes before breakfast daily and has not missed any doses  TSH is normal, slightly improved from last visit       Wt Readings from Last 3 Encounters:  06/03/21 192 lb (87.1 kg)  04/27/21 192 lb (87.1 kg)  04/26/21 193 lb 6.4 oz (87.7 kg)   LABS:  Lab Results  Component Value Date   TSH 3.69 05/28/2021   TSH 4.17 11/27/2020   TSH 2.06 11/29/2019   FREET4 0.89 05/28/2021   FREET4 0.85 11/27/2020   FREET4 0.94 11/29/2019     Allergies as of 06/03/2021       Reactions   Amoxicillin    REACTION: face swelled   Fish Oil Other (See Comments)   Ibuprofen Swelling   Face swell    Saw Palmetto Other (See Comments)        Medication List        Accurate as of June 03, 2021  4:06 PM. If you have any questions, ask your nurse or doctor.          acetaminophen 500 MG tablet Commonly known as: TYLENOL Take 500 mg by mouth every 6 (six) hours as needed.   cholecalciferol 1000 units tablet Commonly known as: VITAMIN D Take 2,000 Units by mouth daily. 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 He has been smoking an average of 1.00 packs per day. He has never used smokeless tobacco. He reports current alcohol use. He reports that he does not use drugs.  Allergies:  Allergies  Allergen Reactions   Amoxicillin     REACTION: face swelled   Fish Oil Other (See Comments)   Ibuprofen Swelling    Face swell   Saw Palmetto Other (See Comments)    Review of Systems:  Has history of low pulse rate, had sinus bradycardia on his EKG previously in 2015 Recently diagnosed to have mitral regurgitation No dyspnea on exertion   Has previous history of hypercholesterolemia He does not restrict high saturated fat  meats      Lab Results  Component Value Date   CHOL 193 05/28/2021   Lab Results  Component Value Date   HDL 40.10 05/28/2021   Lab Results  Component Value Date   LDLCALC 130 (H) 05/28/2021   Lab Results  Component Value Date   TRIG 111.0 05/28/2021   Lab Results  Component Value Date   CHOLHDL 5 05/28/2021   No results found for: LDLDIRECT    Examination:   BP 124/82   Pulse 60   Ht 5\' 10"  (1.778 m)   Wt 192 lb (87.1 kg)   SpO2 98%   BMI 27.55 kg/m      Assessment/Plan:  Post ablative hypothyroidism:  He is on a consistent regimen with 88 mcg generic levothyroxine, taking 1 tablet daily Subjectively doing well and weight is stable  TSH is 3.7 is subjectively doing well and can continue same dose  Hypercholesterolemia given a list of high saturated fat foods to restrict and follow-up with PCP for periodic lipid monitoring No other risk factors with high LDL and can be managed with diet alone   Follow for thyroid annually now  Elayne Snare 06/03/2021, 4:06 PM

## 2021-06-03 NOTE — Telephone Encounter (Signed)
Left message to call back regarding CT results  

## 2021-06-04 MED ORDER — LEVOTHYROXINE SODIUM 88 MCG PO TABS: ORAL_TABLET | ORAL | 3 refills | Status: DC

## 2021-06-07 ENCOUNTER — Telehealth: Payer: Self-pay | Admitting: Cardiovascular Disease

## 2021-06-07 DIAGNOSIS — Z79899 Other long term (current) drug therapy: Secondary | ICD-10-CM

## 2021-06-07 MED ORDER — ATORVASTATIN CALCIUM 40 MG PO TABS: 40.0000 mg | ORAL_TABLET | Freq: Every day | ORAL | 3 refills | Status: DC

## 2021-06-07 NOTE — Telephone Encounter (Signed)
Patient returning call in regards to CT results.

## 2021-06-07 NOTE — Telephone Encounter (Signed)
Pt made aware of Calcium score results along with MD's recommendations. Pt verbalized understanding and orders placed.   Pt also made aware of ECHO results. Pt has an appointment scheduled for 7/26 but nurse offered an appointment for 6/29. Pt refused and state he will keep appointment on 7/26.

## 2021-06-09 ENCOUNTER — Ambulatory Visit: Payer: PPO | Admitting: Cardiovascular Disease

## 2021-06-09 ENCOUNTER — Other Ambulatory Visit: Payer: Self-pay

## 2021-06-09 ENCOUNTER — Encounter: Payer: Self-pay | Admitting: Cardiovascular Disease

## 2021-06-09 VITALS — BP 138/82 | HR 52 | Ht 70.0 in | Wt 192.0 lb

## 2021-06-09 DIAGNOSIS — R001 Bradycardia, unspecified: Secondary | ICD-10-CM | POA: Diagnosis not present

## 2021-06-09 DIAGNOSIS — E785 Hyperlipidemia, unspecified: Secondary | ICD-10-CM | POA: Diagnosis not present

## 2021-06-09 DIAGNOSIS — I340 Nonrheumatic mitral (valve) insufficiency: Secondary | ICD-10-CM | POA: Diagnosis not present

## 2021-06-09 NOTE — Progress Notes (Signed)
Mr. Conry returns today for follow-up of his noninvasive test.  He had an echo for auscultated murmur which showed normal LV systolic function, mildly dilated left ventricle with an end-diastolic dimension of 56 mm, moderate biatrial enlargement and moderate to severe MR with mild AI.  He is completely asymptomatic.  He does continue to smoke.  We also obtained a coronary calcium score on him 05/31/2021 that was 66 mostly in the right coronary artery.  I did put him on atorvastatin 40 mg a day because of a LDL of 130 performed a lipid profile 05/28/2021.  At this point, I do not feel he requires mitral valve repair but I suspect this will need to occur sometime in the near future.  I am going to recheck a lipid liver profile in 3 months.  We will check a 2D echo and 1 year to look at LV function and size.  He currently is in sinus rhythm/sinus bradycardia.  EKG reveals sinus bradycardia at 52 with PACs.  Lorretta Harp, M.D., [place], [facility], [place]ver[place] [address] [place], [place]  [address]  [phone] 06/09/2021 8:27 AM

## 2021-06-09 NOTE — Patient Instructions (Addendum)
Medication Instructions:  Your physician recommends that you continue on your current medications as directed. Please refer to the Current Medication list given to you today.  *If you need a refill on your cardiac medications before your next appointment, please call your pharmacy*   Lab Work: Your physician recommends that you return for lab work in 3 MONTHS:  Fasting lipid panel- DO NOT EAT OR DRINK PAST MIDNIGHT. OKAY TO HAVE WATER HEPATIC (LIVER) FUNTION TEST If you have labs (blood work) drawn today and your tests are completely normal, you will receive your results only by: [place] (if you have MyChart) OR A paper copy in the mail If you have any lab test that is abnormal or we need to change your treatment, we will call you to review the results.   Testing/Procedures: Your physician has requested that you have an echocardiogram. Echocardiography is a painless test that uses sound waves to create images of your heart. It provides your doctor with information about the size and shape of your heart and how well your heart's chambers and valves are working. This procedure takes approximately one hour. There are no restrictions for this procedure.  Please schedule for 1 year    Follow-Up: At [facility], you and your health needs are our priority.  As part of our continuing mission to provide you with exceptional heart care, we have created designated Provider Care Teams.  These Care Teams include your primary Cardiologist (physician) and Advanced Practice Providers (APPs -  Physician Assistants and Nurse Practitioners) who all work together to provide you with the care you need, when you need it.   Your next appointment:   6 month(s)  The format for your next appointment:   In Person  Provider:   Quay Burow, MD  Other Instructions  Heart-Healthy Eating Plan Heart-healthy meal planning includes: Eating less unhealthy fats. Eating more healthy fats. Making  other changes in your diet. Talk with your doctor or a diet specialist (dietitian) to create an eating plan that is right for you. What is my plan? Your doctor may recommend an eating plan that includes: Total fat: ______% or less of total calories a day. Saturated fat: ______% or less of total calories a day. Cholesterol: less than _________mg a day. What are tips for following this plan? Cooking Avoid frying your food. Try to bake, boil, grill, or broil it instead. You can also reduce fat by: Removing the skin from poultry. Removing all visible fats from meats. Steaming vegetables in water or broth. Meal planning  At meals, divide your plate into four equal parts: Fill one-half of your plate with vegetables and green salads. Fill one-fourth of your plate with whole grains. Fill one-fourth of your plate with lean protein foods. Eat 4-5 servings of vegetables per day. A serving of vegetables is: 1 cup of raw or cooked vegetables. 2 cups of raw leafy greens. Eat 4-5 servings of fruit per day. A serving of fruit is: 1 medium whole fruit.  cup of dried fruit.  cup of fresh, frozen, or canned fruit.  cup of 100% fruit juice. Eat more foods that have soluble fiber. These are apples, broccoli, carrots, beans, peas, and barley. Try to get 20-30 g of fiber per day. Eat 4-5 servings of nuts, legumes, and seeds per week: 1 serving of dried beans or legumes equals  cup after being cooked. 1 serving of nuts is  cup. 1 serving of seeds equals 1 tablespoon.  General information Eat  more home-cooked food. Eat less restaurant, buffet, and fast food. Limit or avoid alcohol. Limit foods that are high in starch and sugar. Avoid fried foods. Lose weight if you are overweight. Keep track of how much salt (sodium) you eat. This is important if you have high blood pressure. Ask your doctor to tell you more about this. Try to add vegetarian meals each week. Fats Choose healthy fats. These  include olive oil and canola oil, flaxseeds, walnuts, almonds, and seeds. Eat more omega-3 fats. These include salmon, mackerel, sardines, tuna, flaxseed oil, and ground flaxseeds. Try to eat fish at least 2 times each week. Check food labels. Avoid foods with trans fats or high amounts of saturated fat. Limit saturated fats. These are often found in animal products, such as meats, butter, and cream. These are also found in plant foods, such as palm oil, palm kernel oil, and coconut oil. Avoid foods with partially hydrogenated oils in them. These have trans fats. Examples are stick margarine, some tub margarines, cookies, crackers, and other baked goods. What foods can I eat? Fruits All fresh, canned (in natural juice), or frozen fruits. Vegetables Fresh or frozen vegetables (raw, steamed, roasted, or grilled). Green salads. Grains Most grains. Choose whole wheat and whole grains most of the time. Rice andpasta, including brown rice and pastas made with whole wheat. Meats and other proteins Lean, well-trimmed beef, veal, pork, and lamb. Chicken and [place] without skin. All fish and shellfish. Wild duck, rabbit, pheasant, and venison. Egg whites or low-cholesterol egg substitutes. Dried beans, peas, lentils, and tofu. Seedsand most nuts. Dairy Low-fat or nonfat cheeses, including ricotta and mozzarella. Skim or 1% milk that is liquid, powdered, or evaporated. Buttermilk that is made with low-fatmilk. Nonfat or low-fat yogurt. Fats and oils Non-hydrogenated (trans-free) margarines. Vegetable oils, including soybean, sesame, sunflower, olive, peanut, safflower, corn, canola, and cottonseed. Salad dressings or mayonnaisemade with a vegetable oil. Beverages Mineral water. Coffee and tea. Diet carbonated beverages. Sweets and desserts Sherbet, gelatin, and fruit ice. Small amounts of dark chocolate. Limit all sweets and desserts. Seasonings and condiments All seasonings and condiments. The items  listed above may not be a complete list of foods and drinks you can eat. Contact a dietitian for more options. What foods should I avoid? Fruits Canned fruit in heavy syrup. Fruit in cream or butter sauce. Fried fruit. Limitcoconut. Vegetables Vegetables cooked in cheese, cream, or butter sauce. Fried vegetables. Grains Breads that are made with saturated or trans fats, oils, or whole milk. Croissants. Sweet rolls. Donuts. High-fat crackers,such as cheese crackers. Meats and other proteins Fatty meats, such as hot dogs, ribs, sausage, bacon, rib-eye roast or steak. High-fat deli meats, such as salami and bologna. Caviar. Domestic duck andgoose. Organ meats, such as liver. Dairy Cream, sour cream, cream cheese, and creamed cottage cheese. Whole-milk cheeses. Whole or 2% milk that is liquid, evaporated, or condensed. Whole buttermilk. Cream sauce or high-fat cheese sauce. Yogurt that is made fromwhole milk. Fats and oils Meat fat, or shortening. Cocoa butter, hydrogenated oils, palm oil, coconut oil, palm kernel oil. Solid fats and shortenings, including bacon fat, salt pork, lard, and butter. Nondairy cream substitutes. Salad dressings with cheeseor sour cream. Beverages Regular sodas and juice drinks with added sugar. Sweets and desserts Frosting. Pudding. Cookies. Cakes. Pies. Milk chocolate or white chocolate.Buttered syrups. Full-fat ice cream or ice cream drinks. The items listed above may not be a complete list of foods and drinks to avoid. Contact a dietitian for more  information. Summary Heart-healthy meal planning includes eating less unhealthy fats, eating more healthy fats, and making other changes in your diet. Eat a balanced diet. This includes fruits and vegetables, low-fat or nonfat dairy, lean protein, nuts and legumes, whole grains, and heart-healthy oils and fats. This information is not intended to replace advice given to you by your health care provider. Make sure you discuss  any questions you have with your healthcare provider. Document Revised: 02/01/2018 Document Reviewed: 01/05/2018 Elsevier Patient Education  2022 [employer].

## 2021-07-06 ENCOUNTER — Ambulatory Visit: Payer: PPO | Admitting: Cardiovascular Disease

## 2021-07-09 DIAGNOSIS — M25552 Pain in left hip: Secondary | ICD-10-CM | POA: Diagnosis not present

## 2021-07-09 DIAGNOSIS — M7632 Iliotibial band syndrome, left leg: Secondary | ICD-10-CM | POA: Diagnosis not present

## 2021-07-15 DIAGNOSIS — M25552 Pain in left hip: Secondary | ICD-10-CM | POA: Diagnosis not present

## 2021-09-21 DIAGNOSIS — R059 Cough, unspecified: Secondary | ICD-10-CM | POA: Diagnosis not present

## 2021-09-21 DIAGNOSIS — J40 Bronchitis, not specified as acute or chronic: Secondary | ICD-10-CM | POA: Diagnosis not present

## 2021-09-28 ENCOUNTER — Ambulatory Visit: Payer: PPO | Admitting: Cardiovascular Disease

## 2021-11-18 ENCOUNTER — Telehealth: Payer: Self-pay | Admitting: Cardiovascular Disease

## 2021-11-18 NOTE — Telephone Encounter (Signed)
*  STAT* If patient is at the pharmacy, call can be transferred to refill team.   1. Which medications need to be refilled? (please list name of each medication and dose if known) Atorvastatin  2. Which pharmacy/location (including street and city if local pharmacy) is medication to be sent to? [facility] [address], ,[place]  3. Do they need a 30 day or 90 day supply? 90 days and refills

## 2021-11-19 MED ORDER — ATORVASTATIN CALCIUM 40 MG PO TABS: 40.0000 mg | ORAL_TABLET | Freq: Every day | ORAL | 2 refills | Status: AC

## 2021-11-19 NOTE — Telephone Encounter (Signed)
Noted. Medication refill was sent to pts pharmacy as requested. Left a message for pt, letting pt know RX was sent to his pharmacy.

## 2021-11-23 ENCOUNTER — Ambulatory Visit: Payer: PPO | Admitting: Cardiovascular Disease

## 2022-02-15 ENCOUNTER — Ambulatory Visit: Payer: PPO | Admitting: Cardiovascular Disease

## 2022-02-28 DIAGNOSIS — H6123 Impacted cerumen, bilateral: Secondary | ICD-10-CM | POA: Diagnosis not present

## 2022-02-28 DIAGNOSIS — F17210 Nicotine dependence, cigarettes, uncomplicated: Secondary | ICD-10-CM | POA: Diagnosis not present

## 2022-02-28 DIAGNOSIS — Z8709 Personal history of other diseases of the respiratory system: Secondary | ICD-10-CM | POA: Diagnosis not present

## 2022-03-19 ENCOUNTER — Other Ambulatory Visit: Payer: Self-pay | Admitting: Endocrinology

## 2022-03-29 DIAGNOSIS — I7090 Unspecified atherosclerosis: Secondary | ICD-10-CM | POA: Diagnosis not present

## 2022-03-29 DIAGNOSIS — F17200 Nicotine dependence, unspecified, uncomplicated: Secondary | ICD-10-CM | POA: Diagnosis not present

## 2022-03-29 DIAGNOSIS — Z0000 Encounter for general adult medical examination without abnormal findings: Secondary | ICD-10-CM | POA: Diagnosis not present

## 2022-03-29 DIAGNOSIS — Z23 Encounter for immunization: Secondary | ICD-10-CM | POA: Diagnosis not present

## 2022-03-29 DIAGNOSIS — E7849 Other hyperlipidemia: Secondary | ICD-10-CM | POA: Diagnosis not present

## 2022-03-29 DIAGNOSIS — N401 Enlarged prostate with lower urinary tract symptoms: Secondary | ICD-10-CM | POA: Diagnosis not present

## 2022-03-29 DIAGNOSIS — I700 Atherosclerosis of aorta: Secondary | ICD-10-CM | POA: Diagnosis not present

## 2022-03-29 DIAGNOSIS — I349 Nonrheumatic mitral valve disorder, unspecified: Secondary | ICD-10-CM | POA: Diagnosis not present

## 2022-03-29 DIAGNOSIS — R030 Elevated blood-pressure reading, without diagnosis of hypertension: Secondary | ICD-10-CM | POA: Diagnosis not present

## 2022-03-29 DIAGNOSIS — R001 Bradycardia, unspecified: Secondary | ICD-10-CM | POA: Diagnosis not present

## 2022-03-29 DIAGNOSIS — Z125 Encounter for screening for malignant neoplasm of prostate: Secondary | ICD-10-CM | POA: Diagnosis not present

## 2022-03-29 DIAGNOSIS — E890 Postprocedural hypothyroidism: Secondary | ICD-10-CM | POA: Diagnosis not present

## 2022-03-29 DIAGNOSIS — Z136 Encounter for screening for cardiovascular disorders: Secondary | ICD-10-CM | POA: Diagnosis not present

## 2022-03-30 ENCOUNTER — Other Ambulatory Visit: Payer: Self-pay | Admitting: Family Medicine

## 2022-03-30 DIAGNOSIS — Z136 Encounter for screening for cardiovascular disorders: Secondary | ICD-10-CM

## 2022-04-06 DIAGNOSIS — M62838 Other muscle spasm: Secondary | ICD-10-CM | POA: Diagnosis not present

## 2022-04-06 DIAGNOSIS — M542 Cervicalgia: Secondary | ICD-10-CM | POA: Diagnosis not present

## 2022-04-07 DIAGNOSIS — M542 Cervicalgia: Secondary | ICD-10-CM | POA: Diagnosis not present

## 2022-04-11 ENCOUNTER — Other Ambulatory Visit: Payer: Self-pay | Admitting: Family Medicine

## 2022-04-11 DIAGNOSIS — R591 Generalized enlarged lymph nodes: Secondary | ICD-10-CM

## 2022-04-21 ENCOUNTER — Ambulatory Visit
Admission: RE | Admit: 2022-04-21 | Discharge: 2022-04-21 | Disposition: A | Discharge status: Home / Self Care | Payer: PPO | Source: Ambulatory Visit | Attending: Family Medicine | Admitting: Family Medicine

## 2022-04-21 DIAGNOSIS — R070 Pain in throat: Secondary | ICD-10-CM | POA: Diagnosis not present

## 2022-04-21 DIAGNOSIS — Z136 Encounter for screening for cardiovascular disorders: Secondary | ICD-10-CM | POA: Diagnosis not present

## 2022-04-21 DIAGNOSIS — Z87891 Personal history of nicotine dependence: Secondary | ICD-10-CM | POA: Diagnosis not present

## 2022-04-21 DIAGNOSIS — R591 Generalized enlarged lymph nodes: Secondary | ICD-10-CM

## 2022-04-21 MED ORDER — IOPAMIDOL (ISOVUE-300) INJECTION 61%
75.0000 mL | Freq: Once | INTRAVENOUS | Status: AC | PRN
  Administered 2022-04-21: 75 mL via INTRAVENOUS

## 2022-04-22 DIAGNOSIS — N5201 Erectile dysfunction due to arterial insufficiency: Secondary | ICD-10-CM | POA: Diagnosis not present

## 2022-04-22 DIAGNOSIS — R351 Nocturia: Secondary | ICD-10-CM | POA: Diagnosis not present

## 2022-04-22 DIAGNOSIS — N401 Enlarged prostate with lower urinary tract symptoms: Secondary | ICD-10-CM | POA: Diagnosis not present

## 2022-04-26 ENCOUNTER — Ambulatory Visit (INDEPENDENT_AMBULATORY_CARE_PROVIDER_SITE_OTHER)
Admission: RE | Admit: 2022-04-26 | Discharge: 2022-04-26 | Disposition: A | Discharge status: Home / Self Care | Payer: PPO | Source: Ambulatory Visit | Attending: Acute Care | Admitting: Acute Care

## 2022-04-26 DIAGNOSIS — F17210 Nicotine dependence, cigarettes, uncomplicated: Secondary | ICD-10-CM | POA: Diagnosis not present

## 2022-04-26 DIAGNOSIS — Z87891 Personal history of nicotine dependence: Secondary | ICD-10-CM | POA: Diagnosis not present

## 2022-04-28 ENCOUNTER — Other Ambulatory Visit: Payer: Self-pay | Admitting: Acute Care

## 2022-04-28 DIAGNOSIS — Z122 Encounter for screening for malignant neoplasm of respiratory organs: Secondary | ICD-10-CM

## 2022-04-28 DIAGNOSIS — Z87891 Personal history of nicotine dependence: Secondary | ICD-10-CM

## 2022-04-28 DIAGNOSIS — F17210 Nicotine dependence, cigarettes, uncomplicated: Secondary | ICD-10-CM

## 2022-05-05 DIAGNOSIS — M542 Cervicalgia: Secondary | ICD-10-CM | POA: Insufficient documentation

## 2022-05-10 DIAGNOSIS — M109 Gout, unspecified: Secondary | ICD-10-CM | POA: Diagnosis not present

## 2022-05-10 DIAGNOSIS — M79675 Pain in left toe(s): Secondary | ICD-10-CM | POA: Diagnosis not present

## 2022-06-03 ENCOUNTER — Other Ambulatory Visit (INDEPENDENT_AMBULATORY_CARE_PROVIDER_SITE_OTHER): Payer: PPO

## 2022-06-03 DIAGNOSIS — E890 Postprocedural hypothyroidism: Secondary | ICD-10-CM

## 2022-06-04 LAB — T4, FREE: Free T4: 1.28 ng/dL (ref 0.82–1.77)

## 2022-06-04 LAB — TSH: TSH: 6.06 u[IU]/mL — ABNORMAL HIGH (ref 0.450–4.500)

## 2022-06-06 ENCOUNTER — Other Ambulatory Visit: Payer: Self-pay

## 2022-06-06 DIAGNOSIS — I340 Nonrheumatic mitral (valve) insufficiency: Secondary | ICD-10-CM

## 2022-06-09 ENCOUNTER — Ambulatory Visit: Payer: PPO | Admitting: Endocrinology

## 2022-06-09 ENCOUNTER — Encounter: Payer: Self-pay | Admitting: Endocrinology

## 2022-06-09 ENCOUNTER — Other Ambulatory Visit (HOSPITAL_COMMUNITY): Payer: PPO

## 2022-06-09 VITALS — BP 124/82 | HR 63 | Ht 70.0 in | Wt 194.4 lb

## 2022-06-09 DIAGNOSIS — E890 Postprocedural hypothyroidism: Secondary | ICD-10-CM

## 2022-06-09 MED ORDER — LEVOTHYROXINE SODIUM 100 MCG PO TABS: 100.0000 ug | ORAL_TABLET | Freq: Every day | ORAL | 3 refills | Status: DC

## 2022-06-09 NOTE — Progress Notes (Signed)
Patient ID: Terry Miranda, male   DOB: [birth year], 71 y.o.   MRN: [id number]    Reason for Appointment:  Post ablative hypothyroidism  followup visit   History of Present Illness:   Hyperthyroidism was first diagnosed in 11/2012 Initial symptoms at diagnosis were shakiness, some fatigue and feeling warm. Also had lost 20 pounds  Treatment in 12/2012 was with 5 mg methimazole twice a day. He had good results with this and the medication was tapered off and stopped in 5/14. Because of relapsing hyperthyroidism in 6/14 with minimal symptoms  he was started back on methimazole 5 mg daily Subsequently with stopping his medication in 11/14 he relapsed with his hyperthyroidism and was restarted on methimazole in 12/14 Since he was not able to wean off his methimazole again he was given 15.9 mCi of I-131 on 03/20/14  He developed  hypothyroidism and was started on Synthroid 112 mcg on 04/24/14 This dose was reduced in 6/15 and is since then taking 88 g daily  He had been taking the 88 mcg generic levothyroxine He does not think the pharmacy has changed his tablet size or color  Did not feel any different on the dosage change  He feels fairly good with no complaints of fatigue or sleepiness No cold intolerance  Weight is stable   He is consistent with taking his levothyroxine 30 minutes before breakfast daily  Recently has not been out of his medications  TSH is higher than usual at 6.1       Wt Readings from Last 3 Encounters:  06/09/22 194 lb 6.4 oz (88.2 kg)  06/09/21 192 lb (87.1 kg)  06/03/21 192 lb (87.1 kg)   LABS:  Lab Results  Component Value Date   TSH 6.060 (H) 06/03/2022   TSH 3.69 05/28/2021   TSH 4.17 11/27/2020   FREET4 1.28 06/03/2022   FREET4 0.89 05/28/2021   FREET4 0.85 11/27/2020     Allergies as of 06/09/2022       Reactions   Amoxicillin    REACTION: face swelled   Fish Oil Other (See Comments)   Ibuprofen Swelling   Face swell   Saw  Palmetto Other (See Comments)        Medication List        Accurate as of June 09, 2022  4:00 PM. If you have any questions, ask your nurse or doctor.          acetaminophen 500 MG tablet Commonly known as: TYLENOL Take 500 mg by mouth every 6 (six) hours as needed.   atorvastatin 40 MG tablet Commonly known as: LIPITOR Take 1 tablet (40 mg total) by mouth daily.   atorvastatin 40 MG tablet Commonly known as: LIPITOR 1 tablet   cholecalciferol 1000 units tablet Commonly known as: VITAMIN D Take 2,000 Units by mouth daily. 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 He has been smoking an average of 1 pack per day. He has never used smokeless tobacco. He reports current alcohol use. He reports that he does not use drugs.  Allergies:  Allergies  Allergen Reactions   Amoxicillin     REACTION: face swelled   Fish Oil Other (See Comments)   Ibuprofen Swelling    Face swell   Saw Palmetto Other (See Comments)    Review of Systems:  Has history of low pulse rate, had sinus bradycardia on his EKG previously in  2015 Recently diagnosed to have mitral regurgitation No dyspnea on exertion   Has previous history of hypercholesterolemia He does not restrict high saturated fat meats      Lab Results  Component Value Date   CHOL 193 05/28/2021   Lab Results  Component Value Date   HDL 40.10 05/28/2021   Lab Results  Component Value Date   LDLCALC 130 (H) 05/28/2021   Lab Results  Component Value Date   TRIG 111.0 05/28/2021   Lab Results  Component Value Date   CHOLHDL 5 05/28/2021   No results found for: "LDLDIRECT"    Examination:   BP 124/82   Pulse 63   Ht '5\' 10"'$  (1.778 m)   Wt 194 lb 6.4 oz (88.2 kg)   SpO2 99%   BMI 27.89 kg/m      Assessment/Plan:  Post ablative hypothyroidism:  He has been on a consistent regimen with 88 mcg generic levothyroxine, taking 1 tablet daily Subjectively doing well and has no significant weight change  Despite his TSH being high at 6.1 he does not feel he is more fatigued Has not missed any doses with his levothyroxine every morning  He will go up to 100 mcg and follow-up in 3 months    Cashlynn Yearwood 06/09/2022, 4:00 PM

## 2022-06-21 ENCOUNTER — Ambulatory Visit (HOSPITAL_COMMUNITY): Payer: PPO | Attending: Cardiovascular Disease

## 2022-06-21 DIAGNOSIS — I340 Nonrheumatic mitral (valve) insufficiency: Secondary | ICD-10-CM | POA: Insufficient documentation

## 2022-06-22 LAB — ECHOCARDIOGRAM COMPLETE
Area-P 1/2: 2.62 cm2
MV M vel: 6.05 m/s
MV Peak grad: 146.4 mmHg
P 1/2 time: 778 msec
Radius: 1.5 cm
S' Lateral: 3.7 cm

## 2022-08-02 ENCOUNTER — Other Ambulatory Visit: Payer: Self-pay | Admitting: Endocrinology

## 2022-09-08 ENCOUNTER — Other Ambulatory Visit (INDEPENDENT_AMBULATORY_CARE_PROVIDER_SITE_OTHER): Payer: PPO

## 2022-09-08 DIAGNOSIS — E890 Postprocedural hypothyroidism: Secondary | ICD-10-CM

## 2022-09-09 LAB — T4, FREE: Free T4: 0.94 ng/dL (ref 0.60–1.60)

## 2022-09-09 LAB — TSH: TSH: 2.26 u[IU]/mL (ref 0.35–5.50)

## 2022-09-12 ENCOUNTER — Encounter: Payer: Self-pay | Admitting: Endocrinology

## 2022-09-12 ENCOUNTER — Ambulatory Visit: Payer: PPO | Admitting: Endocrinology

## 2022-09-12 VITALS — BP 122/76 | HR 56 | Ht 70.0 in | Wt 195.4 lb

## 2022-09-12 DIAGNOSIS — E890 Postprocedural hypothyroidism: Secondary | ICD-10-CM | POA: Diagnosis not present

## 2022-09-12 NOTE — Progress Notes (Signed)
Patient ID: Terry Miranda, male   DOB: 01/06/1951, 71 y.o.   MRN: [id number]    Reason for Appointment:  Post ablative hypothyroidism  followup visit   History of Present Illness:   Hyperthyroidism was first diagnosed in 11/2012 Initial symptoms at diagnosis were shakiness, some fatigue and feeling warm. Also had lost 20 pounds  Treatment in 12/2012 was with 5 mg methimazole twice a day. He had good results with this and the medication was tapered off and stopped in 5/14. Because of relapsing hyperthyroidism in 6/14 with minimal symptoms  he was started back on methimazole 5 mg daily Subsequently with stopping his medication in 11/14 he relapsed with his hyperthyroidism and was restarted on methimazole in 12/14 Since he was not able to wean off his methimazole again he was given 15.9 mCi of I-131 on 03/20/14  He developed  hypothyroidism and was started on Synthroid 112 mcg on 04/24/14 This dose was reduced in 6/15 and is since then taking 88 g daily  He had been taking the 88 mcg generic levothyroxine Since 6/23 he has been taking 100 mcg because of a high TSH of 6.1 without change in compliance with his dosage  Did not feel any different on the dosage change  He feels fairly good with no complaints of fatigue or sleepiness No cold intolerance  Weight is unchanged  He is consistent with taking his levothyroxine 30 minutes before breakfast daily   TSH which was higher than usual at 6.1 is back down to 2.3       Wt Readings from Last 3 Encounters:  09/12/22 195 lb 6.4 oz (88.6 kg)  06/09/22 194 lb 6.4 oz (88.2 kg)  06/09/21 192 lb (87.1 kg)   LABS:  Lab Results  Component Value Date   TSH 2.26 09/08/2022   TSH 6.060 (H) 06/03/2022   TSH 3.69 05/28/2021   FREET4 0.94 09/08/2022   FREET4 1.28 06/03/2022   FREET4 0.89 05/28/2021     Allergies as of 09/12/2022       Reactions   Amoxicillin    REACTION: face swelled   Fish Oil Other (See Comments)   Ibuprofen  Swelling   Face swell   Saw [place]met[place] Other (See Comments)        Medication List        Accurate as of September 12, 2022  3:42 PM. If you have any questions, ask your nurse or doctor.          acetaminophen 500 MG tablet Commonly known as: TYLENOL Take 500 mg by mouth every 6 (six) hours as needed.   atorvastatin 40 MG tablet Commonly known as: LIPITOR Take 1 tablet (40 mg total) by mouth daily. What changed: Another medication with the same name was removed. Continue taking this medication, and follow the directions you see here. Changed by: Elayne Snare, MD   cholecalciferol 1000 units tablet Commonly known as: VITAMIN D Take 2,000 Units by mouth daily. Two tablets once a day   levothyroxine 100 MCG tablet Commonly known as: SYNTHROID Take 1 tablet (100 mcg total) by mouth daily.   levothyroxine 88 MCG tablet Commonly known as: SYNTHROID TAKE 1 TABLET BY MOUTH EVERY DAY   tamsulosin 0.4 MG Caps capsule Commonly known as: FLOMAX TAKE 1 CAPSULE BY MOUTH EVERY DAY            Past Medical History:  Diagnosis Date   Arthritis    Atypical nevus 02/11/2008   Left Lower  Deltoid-Slight   Atypical nevus 08/26/2003   Upper Mid Right Back Sup-Slight to Moderate (w/s), Upper Mid Right Back Inf(Slight), Mid Back-Slight to Moderate(w/s), Mid Right Back Sup-Slight to Moderate(w/s), and Mid Right Back Inf-Slight to Moderate(w/s)   Thyroid disease     Past Surgical History:  Procedure Laterality Date   COLONOSCOPY     HERNIA REPAIR      Family History  Problem Relation Age of Onset   Thyroid cancer Brother    Colon cancer Neg Hx    Diabetes Neg Hx     Social History:  reports that he has been smoking cigarettes. He has been smoking an average of 1 pack per day. He has never used smokeless tobacco. He reports current alcohol use. He reports that he does not use drugs.  Allergies:  Allergies  Allergen Reactions   Amoxicillin     REACTION: face swelled   Fish Oil  Other (See Comments)   Ibuprofen Swelling    Face swell   Saw [place]met[place] Other (See Comments)    Review of Systems:  Has history of low pulse rate, had sinus bradycardia on his EKG previously in 2015 diagnosed to have mitral regurgitation No dyspnea on exertion   Has previous history of hypercholesterolemia He does not restrict high saturated fat meats      Lab Results  Component Value Date   CHOL 193 05/28/2021   Lab Results  Component Value Date   HDL 40.10 05/28/2021   Lab Results  Component Value Date   LDLCALC 130 (H) 05/28/2021   Lab Results  Component Value Date   TRIG 111.0 05/28/2021   Lab Results  Component Value Date   CHOLHDL 5 05/28/2021   No results found for: "LDLDIRECT"    Examination:   BP 122/76   Pulse (!) 56   Ht '5\' 10"'$  (1.778 m)   Wt 195 lb 6.4 oz (88.6 kg)   SpO2 99%   BMI 28.04 kg/m    Assessment/Plan:  Post ablative hypothyroidism:  He has been taking 100 mcg levothyroxine since June TSH is back to normal Apparently he did not have any symptoms with his TSH of about 6 as increasing the dose did not make him feel any different  He will continue 100 mcg and follow-up next July  Elayne Snare 09/12/2022, 3:42 PM

## 2022-12-22 DIAGNOSIS — M216X2 Other acquired deformities of left foot: Secondary | ICD-10-CM | POA: Diagnosis not present

## 2022-12-22 DIAGNOSIS — L851 Acquired keratosis [keratoderma] palmaris et plantaris: Secondary | ICD-10-CM | POA: Diagnosis not present

## 2022-12-22 DIAGNOSIS — M216X1 Other acquired deformities of right foot: Secondary | ICD-10-CM | POA: Diagnosis not present

## 2022-12-29 ENCOUNTER — Emergency Department (HOSPITAL_COMMUNITY)
Admission: EM | Admit: 2022-12-29 | Discharge: 2022-12-29 | Disposition: A | Discharge status: Home / Self Care | Payer: PPO | Attending: Emergency Medicine | Admitting: Emergency Medicine

## 2022-12-29 ENCOUNTER — Emergency Department (HOSPITAL_COMMUNITY): Payer: PPO

## 2022-12-29 DIAGNOSIS — I700 Atherosclerosis of aorta: Secondary | ICD-10-CM | POA: Diagnosis not present

## 2022-12-29 DIAGNOSIS — R55 Syncope and collapse: Secondary | ICD-10-CM

## 2022-12-29 DIAGNOSIS — R0789 Other chest pain: Secondary | ICD-10-CM | POA: Diagnosis not present

## 2022-12-29 DIAGNOSIS — W19XXXA Unspecified fall, initial encounter: Secondary | ICD-10-CM | POA: Diagnosis not present

## 2022-12-29 DIAGNOSIS — S01112A Laceration without foreign body of left eyelid and periocular area, initial encounter: Secondary | ICD-10-CM | POA: Insufficient documentation

## 2022-12-29 DIAGNOSIS — W01198A Fall on same level from slipping, tripping and stumbling with subsequent striking against other object, initial encounter: Secondary | ICD-10-CM | POA: Diagnosis not present

## 2022-12-29 DIAGNOSIS — S0993XA Unspecified injury of face, initial encounter: Secondary | ICD-10-CM | POA: Diagnosis not present

## 2022-12-29 DIAGNOSIS — S0592XA Unspecified injury of left eye and orbit, initial encounter: Secondary | ICD-10-CM | POA: Diagnosis present

## 2022-12-29 DIAGNOSIS — S0083XA Contusion of other part of head, initial encounter: Secondary | ICD-10-CM | POA: Diagnosis not present

## 2022-12-29 LAB — TROPONIN I (HIGH SENSITIVITY)
Troponin I (High Sensitivity): 10 ng/L (ref <18)
Troponin I (High Sensitivity): 11 ng/L (ref <18)

## 2022-12-29 LAB — URINALYSIS, ROUTINE W REFLEX MICROSCOPIC
Bilirubin Urine: NEGATIVE
Glucose, UA: NEGATIVE mg/dL
Hgb urine dipstick: NEGATIVE
Ketones, ur: NEGATIVE mg/dL
Leukocytes,Ua: NEGATIVE
Nitrite: NEGATIVE
Protein, ur: 30 mg/dL — AB
Specific Gravity, Urine: 1.023 (ref 1.005–1.030)
pH: 5 (ref 5.0–8.0)

## 2022-12-29 LAB — CBC
HCT: 37.2 % — ABNORMAL LOW (ref 39.0–52.0)
Hemoglobin: 12.4 g/dL — ABNORMAL LOW (ref 13.0–17.0)
MCH: 32.8 pg (ref 26.0–34.0)
MCHC: 33.3 g/dL (ref 30.0–36.0)
MCV: 98.4 fL (ref 80.0–100.0)
Platelets: 204 10*3/uL (ref 150–400)
RBC: 3.78 MIL/uL — ABNORMAL LOW (ref 4.22–5.81)
RDW: 13.3 % (ref 11.5–15.5)
WBC: 14.4 10*3/uL — ABNORMAL HIGH (ref 4.0–10.5)
nRBC: 0 % (ref 0.0–0.2)

## 2022-12-29 LAB — BASIC METABOLIC PANEL
Anion gap: 7 (ref 5–15)
BUN: 24 mg/dL — ABNORMAL HIGH (ref 8–23)
CO2: 25 mmol/L (ref 22–32)
Calcium: 8.8 mg/dL — ABNORMAL LOW (ref 8.9–10.3)
Chloride: 106 mmol/L (ref 98–111)
Creatinine, Ser: 1.1 mg/dL (ref 0.61–1.24)
GFR, Estimated: >60 mL/min (ref >60)
Glucose, Bld: 95 mg/dL (ref 70–99)
Potassium: 4.2 mmol/L (ref 3.5–5.1)
Sodium: 138 mmol/L (ref 135–145)

## 2022-12-29 LAB — CBG MONITORING, ED: Glucose-Capillary: 99 mg/dL (ref 70–99)

## 2022-12-29 MED ORDER — LIDOCAINE-EPINEPHRINE (PF) 2 %-1:200000 IJ SOLN
10.0000 mL | Freq: Once | INTRAMUSCULAR | Status: AC
  Administered 2022-12-29: 10 mL via INTRADERMAL
  Filled 2022-12-29: qty 20

## 2022-12-29 MED ORDER — SODIUM CHLORIDE 0.9 % IV BOLUS
1000.0000 mL | Freq: Once | INTRAVENOUS | Status: AC
  Administered 2022-12-29: 1000 mL via INTRAVENOUS

## 2022-12-29 NOTE — ED Notes (Signed)
Pt is sitting up in bed eating food family brought in by family. No S/S of distress noted.

## 2022-12-29 NOTE — Discharge Instructions (Addendum)
Follow up with your cardiologist.  Return for repeat episodes or chest pain or difficulty breathing. Eat and drink as well as you can for the next couple days.

## 2022-12-29 NOTE — ED Provider Notes (Signed)
[facility] DEPT Provider Note   CSN: [id number] Arrival date & time: 12/29/22  4098     History  Chief Complaint  Patient presents with   Loss of Consciousness    Terry Miranda is a 72 y.o. male.  72 yo M with a chief complaints of a syncopal event. The patient was at a meeting and he was standing started feeling dizzy tried to reach his hand over to grab something and then things got worse and he collapsed to the ground.  He has a history of mitral valve regurgitation.  Sees Dr. Alvester Chou for this.  No recent medication changes no dark stool or blood in his stool no cough congestion or fever.  After the fall he does have some left-sided chest discomfort.  He denied chest pain difficulty breathing headache or neck pain prior to the event.  Denies abdominal pain.  Also struck his face.  Denies any change in vision.   Loss of Consciousness      Home Medications Prior to Admission medications   Medication Sig Start Date End Date Taking? Authorizing Provider  acetaminophen (TYLENOL) 500 MG tablet Take 500 mg by mouth every 6 (six) hours as needed.    [provider]  atorvastatin (LIPITOR) 40 MG tablet Take 1 tablet (40 mg total) by mouth daily. 11/19/21 02/17/22  Lorretta Harp, MD  cholecalciferol (VITAMIN D) 1000 UNITS tablet Take 2,000 Units by mouth daily. Two tablets once a day    [provider]  levothyroxine (SYNTHROID) 100 MCG tablet Take 1 tablet (100 mcg total) by mouth daily. 06/09/22   Elayne Snare, MD  levothyroxine (SYNTHROID) 88 MCG tablet TAKE 1 TABLET BY MOUTH EVERY DAY Patient not taking: Reported on 09/12/2022 08/02/22   Elayne Snare, MD  tamsulosin (FLOMAX) 0.4 MG CAPS capsule TAKE 1 CAPSULE BY MOUTH EVERY DAY 12/19/19   McKenzie, Candee Furbish, MD      Allergies    Amoxicillin, Fish oil, Ibuprofen, and Saw palmetto    Review of Systems   Review of Systems  Cardiovascular:  Positive for syncope.    Physical Exam Updated  Vital Signs BP 131/69   Pulse (!) 59   Temp 98 F (36.7 C) (Oral)   Resp 18   SpO2 95%  Physical Exam Vitals and nursing note reviewed.  Constitutional:      Appearance: He is well-developed.  HENT:     Head: Normocephalic.     Comments: Patient with left eyebrow laceration approximately 1 cm in length.  Slightly gaping.  Small hematoma.  No midline C-spine tenderness able to rotate his head 45 degrees in either direction without pain. Eyes:     Pupils: Pupils are equal, round, and reactive to light.  Neck:     Vascular: No JVD.  Cardiovascular:     Rate and Rhythm: Normal rate and regular rhythm.     Heart sounds: No murmur heard.    No friction rub. No gallop.  Pulmonary:     Effort: No respiratory distress.     Breath sounds: No wheezing.  Abdominal:     General: There is no distension.     Tenderness: There is no guarding or rebound.  Musculoskeletal:        General: Normal range of motion.     Cervical back: Normal range of motion and neck supple.     Comments: Patient with some mild left-sided chest wall tenderness about the midclavicular line about ribs 6 through  8.  No obvious signs of trauma to the chest.   Palpated from head to toe without any other noted areas of bony tenderness.  Skin:    Coloration: Skin is not pale.     Findings: No rash.  Neurological:     Mental Status: He is alert and oriented to person, place, and time.  Psychiatric:        Behavior: Behavior normal.     ED Results / Procedures / Treatments   Labs (all labs ordered are listed, but only abnormal results are displayed) Labs Reviewed  BASIC METABOLIC PANEL - Abnormal; Notable for the following components:      Result Value   BUN 24 (*)    Calcium 8.8 (*)    All other components within normal limits  CBC - Abnormal; Notable for the following components:   WBC 14.4 (*)    RBC 3.78 (*)    Hemoglobin 12.4 (*)    HCT 37.2 (*)    All other components within normal limits  URINALYSIS,  ROUTINE W REFLEX MICROSCOPIC - Abnormal; Notable for the following components:   APPearance HAZY (*)    Protein, ur 30 (*)    Bacteria, UA RARE (*)    All other components within normal limits  CBG MONITORING, ED  CBG MONITORING, ED  TROPONIN I (HIGH SENSITIVITY)  TROPONIN I (HIGH SENSITIVITY)    EKG EKG Interpretation  Date/Time:  Thursday December 29 2022 08:34:06 EST Ventricular Rate:  48 PR Interval:  164 QRS Duration: 92 QT Interval:  468 QTC Calculation: 419 R Axis:   -11 Text Interpretation: Sinus bradycardia Low voltage, precordial leads No old tracing to compare Confirmed by Deno Etienne [phone]) on 12/29/2022 8:53:05 AM  Radiology CT Head Wo Contrast  Result Date: 12/29/2022 CLINICAL DATA:  Syncope, fall, facial injury EXAM: CT HEAD WITHOUT CONTRAST CT MAXILLOFACIAL WITHOUT CONTRAST TECHNIQUE: Multidetector CT imaging of the head and maxillofacial structures were performed using the standard protocol without intravenous contrast. Multiplanar CT image reconstructions of the maxillofacial structures were also generated. RADIATION DOSE REDUCTION: This exam was performed according to the departmental dose-optimization program which includes automated exposure control, adjustment of the mA and/or kV according to patient size and/or use of iterative reconstruction technique. COMPARISON:  None Available. FINDINGS: CT HEAD FINDINGS Brain: No evidence of acute infarction, hemorrhage, hydrocephalus, extra-axial collection or mass lesion/mass effect. Vascular: No hyperdense vessel or unexpected calcification. CT FACIAL BONES FINDINGS Skull: Normal. Negative for fracture or focal lesion. Facial bones: No displaced fractures or dislocations. Sinuses/Orbits: No acute finding. Other: Soft tissue contusion of the left forehead (series 3, image 10). IMPRESSION: 1. No acute intracranial pathology. 2. No displaced fractures or dislocations of the facial bones. 3. Soft tissue contusion of the left  forehead. Electronically Signed   By: Delanna Ahmadi M.D.   On: 12/29/2022 10:02   CT Maxillofacial Wo Contrast  Result Date: 12/29/2022 CLINICAL DATA:  Syncope, fall, facial injury EXAM: CT HEAD WITHOUT CONTRAST CT MAXILLOFACIAL WITHOUT CONTRAST TECHNIQUE: Multidetector CT imaging of the head and maxillofacial structures were performed using the standard protocol without intravenous contrast. Multiplanar CT image reconstructions of the maxillofacial structures were also generated. RADIATION DOSE REDUCTION: This exam was performed according to the departmental dose-optimization program which includes automated exposure control, adjustment of the mA and/or kV according to patient size and/or use of iterative reconstruction technique. COMPARISON:  None Available. FINDINGS: CT HEAD FINDINGS Brain: No evidence of acute infarction, hemorrhage, hydrocephalus, extra-axial collection or mass  lesion/mass effect. Vascular: No hyperdense vessel or unexpected calcification. CT FACIAL BONES FINDINGS Skull: Normal. Negative for fracture or focal lesion. Facial bones: No displaced fractures or dislocations. Sinuses/Orbits: No acute finding. Other: Soft tissue contusion of the left forehead (series 3, image 10). IMPRESSION: 1. No acute intracranial pathology. 2. No displaced fractures or dislocations of the facial bones. 3. Soft tissue contusion of the left forehead. Electronically Signed   By: Delanna Ahmadi M.D.   On: 12/29/2022 10:02   DG Ribs Unilateral W/Chest Left  Result Date: 12/29/2022 CLINICAL DATA:  Syncopal episode.  Fall. EXAM: LEFT RIBS AND CHEST - 3+ VIEW COMPARISON:  Chest radiographs 10/15/2015.  Chest CT 04/26/2022. FINDINGS: The heart size and mediastinal contours are stable with mild aortic atherosclerosis. The lungs appear clear. There is no pleural effusion or pneumothorax. No acute left-sided rib fractures or focal rib lesions are identified. There are degenerative changes throughout the thoracolumbar  spine. IMPRESSION: No evidence of acute left-sided rib fracture, pleural effusion or pneumothorax. Electronically Signed   By: Richardean Sale M.D.   On: 12/29/2022 09:50    Procedures .Marland KitchenLaceration Repair  Date/Time: 12/29/2022 10:34 AM  Performed by: Deno Etienne, DO Authorized by: Deno Etienne, DO   Consent:    Consent obtained:  Verbal   Consent given by:  Patient   Risks, benefits, and alternatives were discussed: yes     Risks discussed:  Infection, pain, poor cosmetic result and poor wound healing   Alternatives discussed:  No treatment, delayed treatment and observation Universal protocol:    Procedure explained and questions answered to patient or proxy's satisfaction: yes     Patient identity confirmed:  Verbally with patient Anesthesia:    Anesthesia method:  Local infiltration   Local anesthetic:  Lidocaine 2% WITH epi Laceration details:    Location:  Face   Face location:  L upper eyelid   Length (cm):  1 Pre-procedure details:    Preparation:  Patient was prepped and draped in usual sterile fashion Exploration:    Limited defect created (wound extended): no     Hemostasis achieved with:  Epinephrine and direct pressure   Imaging obtained comment:  CT   Imaging outcome: foreign body not noted   Treatment:    Area cleansed with:  Soap and water and chlorhexidine   Amount of cleaning:  Standard   Irrigation volume:  20   Irrigation method:  Pressure wash and syringe   Visualized foreign bodies/material removed: no     Debridement:  None   Undermining:  None   Scar revision: no   Skin repair:    Repair method:  Sutures   Suture size:  5-0   Suture material:  Fast-absorbing gut   Suture technique:  Simple interrupted   Number of sutures:  1 Approximation:    Approximation:  Close Repair type:    Repair type:  Simple Post-procedure details:    Dressing:  Adhesive bandage   Procedure completion:  Tolerated well, no immediate complications     Medications  Ordered in ED Medications  lidocaine-EPINEPHrine (XYLOCAINE W/EPI) 2 %-1:200000 (PF) injection 10 mL (10 mLs Intradermal Given by Other 12/29/22 1029)  sodium chloride 0.9 % bolus 1,000 mL (0 mLs Intravenous Stopped 12/29/22 1247)    ED Course/ Medical Decision Making/ A&P                             Medical Decision Making Amount and/or  Complexity of Data Reviewed Labs: ordered. Radiology: ordered.  Risk Prescription drug management.   Patient with head injury after what sounds like a vasovagal syncope event. This is complicated by the fact the patient has mitral valve regurgitation, bradycardia here. He is having some chest discomfort though likely is traumatic will obtain a delta troponin. He tells me his tetanus is up-to-date.   Ct head and Face negative for ICH or acute facial fx as independently interpreted by me.  CXR independently interpreted by me negative for fx or ptx.    Two troponins negative, no significant electrolyte abnormality, no anemia.    Discussed case with Dr. Marisue Ivan cardiology.  Reviewed patients echo recommends close follow up.  1:36 PM:  I have discussed the diagnosis/risks/treatment options with the patient.  Evaluation and diagnostic testing in the emergency department does not suggest an emergent condition requiring admission or immediate intervention beyond what has been performed at this time.  They will follow up with PCP. We also discussed returning to the ED immediately if new or worsening sx occur. We discussed the sx which are most concerning (e.g., sudden worsening pain, fever, inability to tolerate by mouth) that necessitate immediate return. Medications administered to the patient during their visit and any new prescriptions provided to the patient are listed below.  Medications given during this visit Medications  lidocaine-EPINEPHrine (XYLOCAINE W/EPI) 2 %-1:200000 (PF) injection 10 mL (10 mLs Intradermal Given by Other 12/29/22 1029)  sodium  chloride 0.9 % bolus 1,000 mL (0 mLs Intravenous Stopped 12/29/22 1247)     The patient appears reasonably screen and/or stabilized for discharge and I doubt any other medical condition or other [facility] requiring further screening, evaluation, or treatment in the ED at this time prior to discharge.          Final Clinical Impression(s) / ED Diagnoses Final diagnoses:  Syncope and collapse    Rx / DC Orders ED Discharge Orders          Ordered    Ambulatory referral to Cardiology       Comments: If you have not heard from the Cardiology office within the next 72 hours please call [phone].   12/29/22 [place], [place], DO 12/29/22 1336

## 2022-12-29 NOTE — ED Triage Notes (Signed)
Pt arrives via [facility] for syncopal episode from standing. Pt was standing during a meeting and felt faint, falling onto the concrete. Pt struck his L eye and L elbow on the ground. No blood thinners per pt. Pt had orthostatic changes with EMS of 112/65 when lying to 89/52 standing. Pt also bradycardic with EMS with HR 48 with frequent PVCs.

## 2022-12-29 NOTE — ED Provider Triage Note (Signed)
Emergency Medicine Provider Triage Evaluation Note  Terry Miranda , a 72 y.o. male  was evaluated in triage.  Pt complains of a syncopal event.  The patient was at a meeting and he was standing started feeling dizzy tried to reach his hand over to grab something and then things got worse and he collapsed to the ground.  He has a history of mitral valve regurgitation.  Sees Dr. Alvester Chou for this.  No recent medication changes no dark stool or blood in his stool no cough congestion or fever.  After the fall he does have some left-sided chest discomfort.  He denied chest pain difficulty breathing headache or neck pain prior to the event.  Denies abdominal pain.  Also struck his face.  Denies any change in vision.  Review of Systems  Positive: Left-sided chest pain post fall, left facial pain. Negative: Shortness of breath diaphoresis nausea vomiting diarrhea cough congestion fever recent medication change  Physical Exam  BP 112/71   Pulse (!) 54   Temp 98 F (36.7 C) (Oral)   Resp 17   SpO2 98%  Gen:   Awake, no distress   Resp:  Normal effort, CTAB MSK:   Moves extremities without difficulty  Other:  Patient with some mild left-sided chest wall tenderness about the midclavicular line about ribs 6 through 8.  No obvious signs of trauma to the chest.  Patient with left eyebrow laceration approximately 1 cm in length.  Slightly gaping.  Small hematoma.  No midline C-spine tenderness able to rotate his head 45 degrees in either direction without pain.  Palpated from head to toe without any other noted areas of bony tenderness.  Medical Decision Making  Medically screening exam initiated at 9:27 AM.  Appropriate orders placed.  Terry Miranda was informed that the remainder of the evaluation will be completed by another provider, this initial triage assessment does not replace that evaluation, and the importance of remaining in the ED until their evaluation is complete.  Patient with head injury after what  sounds like a vasovagal syncope event.  This is complicated by the fact the patient has mitral valve regurgitation, bradycardia here.  He is having some chest discomfort though likely is traumatic will obtain a delta troponin.  He tells me his tetanus is up-to-date.   Deno Etienne, DO 12/29/22 [phone]

## 2023-01-20 DIAGNOSIS — G4710 Hypersomnia, unspecified: Secondary | ICD-10-CM | POA: Diagnosis not present

## 2023-01-20 DIAGNOSIS — I340 Nonrheumatic mitral (valve) insufficiency: Secondary | ICD-10-CM | POA: Diagnosis not present

## 2023-01-20 DIAGNOSIS — R030 Elevated blood-pressure reading, without diagnosis of hypertension: Secondary | ICD-10-CM | POA: Diagnosis not present

## 2023-01-20 DIAGNOSIS — R55 Syncope and collapse: Secondary | ICD-10-CM | POA: Diagnosis not present

## 2023-01-20 DIAGNOSIS — R053 Chronic cough: Secondary | ICD-10-CM | POA: Diagnosis not present

## 2023-02-24 ENCOUNTER — Encounter: Payer: Self-pay | Admitting: Cardiovascular Disease

## 2023-02-24 ENCOUNTER — Telehealth: Payer: Self-pay | Admitting: Cardiovascular Disease

## 2023-02-24 ENCOUNTER — Ambulatory Visit: Payer: PPO | Attending: Cardiovascular Disease | Admitting: Cardiovascular Disease

## 2023-02-24 ENCOUNTER — Ambulatory Visit: Payer: PPO | Admitting: Cardiovascular Disease

## 2023-02-24 VITALS — BP 134/62 | HR 58 | Ht 70.0 in | Wt 190.4 lb

## 2023-02-24 DIAGNOSIS — E785 Hyperlipidemia, unspecified: Secondary | ICD-10-CM | POA: Insufficient documentation

## 2023-02-24 DIAGNOSIS — I10 Essential (primary) hypertension: Secondary | ICD-10-CM | POA: Diagnosis not present

## 2023-02-24 DIAGNOSIS — R931 Abnormal findings on diagnostic imaging of heart and coronary circulation: Secondary | ICD-10-CM | POA: Diagnosis not present

## 2023-02-24 DIAGNOSIS — I340 Nonrheumatic mitral (valve) insufficiency: Secondary | ICD-10-CM | POA: Diagnosis not present

## 2023-02-24 NOTE — Patient Instructions (Signed)
Medication Instructions:  Your physician recommends that you continue on your current medications as directed. Please refer to the Current Medication list given to you today.  *If you need a refill on your cardiac medications before your next appointment, please call your pharmacy*   Testing/Procedures: Your physician has requested that you have an echocardiogram. Echocardiography is a painless test that uses sound waves to create images of your heart. It provides your doctor with information about the size and shape of your heart and how well your heart's chambers and valves are working. This procedure takes approximately one hour. There are no restrictions for this procedure. Please do NOT wear cologne, perfume, aftershave, or lotions (deodorant is allowed). Please arrive 15 minutes prior to your appointment time. This procedure will be done at [address].  To be done in July.    Follow-Up: At [facility], you and your health needs are our priority.  As part of our continuing mission to provide you with exceptional heart care, we have created designated Provider Care Teams.  These Care Teams include your primary Cardiologist (physician) and Advanced Practice Providers (APPs -  Physician Assistants and Nurse Practitioners) who all work together to provide you with the care you need, when you need it.  We recommend signing up for the patient portal called "MyChart".  Sign up information is provided on this After Visit Summary.  MyChart is used to connect with patients for Virtual Visits (Telemedicine).  Patients are able to view lab/test results, encounter notes, upcoming appointments, etc.  Non-urgent messages can be sent to your provider as well.   To learn more about what you can do with MyChart, go to [email or web address].    Your next appointment:   12 month(s)  Provider:   Quay Burow, MD

## 2023-02-24 NOTE — Assessment & Plan Note (Signed)
History of hyperlipidemia on statin therapy with lipid profile performed 03/29/2022 revealing total cholesterol 107, LDL 52 and HDL 41.

## 2023-02-24 NOTE — Assessment & Plan Note (Signed)
Coronary calcium score performed 05/31/2021 was 43 all of which were in the RCA.  His LDL is at goal for secondary prevention.  He is asymptomatic.

## 2023-02-24 NOTE — Telephone Encounter (Signed)
Pt spouse called in stating pt saw his PCP after his appt with Dr. Gwenlyn Found today. She states PCP wants to start him on Amlodipine 5mg  daily and she is unsure about it. Would like Dr. Kennon Holter take on it, please advise.

## 2023-02-24 NOTE — Progress Notes (Signed)
02/24/2023 [facility]   01/11/1951  [id number]  Primary Physician Lois Huxley, PA Primary Cardiologist: Lorretta Harp MD [name], [place]  HPI:  Terry Miranda is a 72 y.o.  thin appearing married Caucasian male father of 1 child with one grandchild on the way who is wife Terry Miranda is also a patient of mine. He was referred by his primary care provider, Marilynne Drivers PA-C, for evaluation of asymptomatic bradycardia.  I last saw him in the office 06/09/2021.  He was found to have a "slow heart rate" by his occupational nurse at work. Does have a heart rate in the 50s. Has been told that he has a cardiac murmur. His risk factors include 50 pack years of tobacco abuse currently smoking 1 pack/day recalcitrant to risk factor modification. He has no other cardiac risk factors. Is never had a heart attack or stroke. He denies chest pain or shortness of breath. He does walk at work but does do organized exercise otherwise.  Since I saw him in the office almost 2 years ago he continues to do well.  He essentially asymptomatic.  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   Current Meds  Medication Sig   acetaminophen (TYLENOL) 500 MG tablet Take 500 mg by mouth every 6 (six) hours as needed.   atorvastatin (LIPITOR) 40 MG tablet Take 1 tablet (40 mg total) by mouth daily.   cholecalciferol (VITAMIN D) 1000 UNITS tablet Take 2,000 Units by mouth daily. Two tablets once a day   levothyroxine (SYNTHROID) 100 MCG tablet Take 1 tablet (100 mcg total) by mouth daily.   levothyroxine (SYNTHROID) 88 MCG tablet TAKE 1 TABLET BY MOUTH EVERY DAY (Patient taking differently: Take 100 mcg by mouth daily before breakfast. TAKE 1 TABLET BY MOUTH EVERY DAY)   tamsulosin (FLOMAX) 0.4 MG CAPS capsule TAKE 1 CAPSULE BY MOUTH EVERY DAY     Allergies  Allergen Reactions    Amoxicillin     REACTION: face swelled   Fish Oil Other (See Comments)   Ibuprofen Swelling    Face swell   Saw Palmetto Other (See Comments)    Social History   Socioeconomic History   Marital status: Married    Spouse name: Not on file   Number of children: Not on file   Years of education: Not on file   Highest education level: Not on file  Occupational History   Not on file  Tobacco Use   Smoking status: Every Day    Packs/day: 1    Types: Cigarettes   Smokeless tobacco: Never  Substance and Sexual Activity   Alcohol use: Yes    Comment: rare   Drug use: No   Sexual activity: Not on file  Other Topics Concern   Not on file  Social History Narrative   Not on file   Social Determinants of Health   Financial Resource Strain: Not on file  Food Insecurity: Not on file  Transportation Needs: Not on file  Physical Activity: Not on file  Stress: Not on file  Social Connections: Not on file  Intimate Partner Violence: Not on file     Review of Systems: General: negative for chills, fever, night sweats or weight changes.  Cardiovascular: negative for chest pain, dyspnea on exertion, edema, orthopnea, palpitations, paroxysmal nocturnal dyspnea or shortness of breath Dermatological: negative for rash  Respiratory: negative for cough or wheezing Urologic: negative for hematuria Abdominal: negative for nausea, vomiting, diarrhea, bright red blood per rectum, melena, or hematemesis Neurologic: negative for visual changes, syncope, or dizziness All other systems reviewed and are otherwise negative except as noted above.    Blood pressure 134/62, pulse (!) 58, height 5\' 10"  (1.778 m), weight 190 lb 6.4 oz (86.4 kg), SpO2 97 %.  General appearance: alert and no distress Neck: no adenopathy, no JVD, supple, symmetrical, trachea midline, thyroid not enlarged, symmetric, no tenderness/mass/nodules, and bilateral carotid bruits versus transmitted murmur Lungs: clear to  auscultation bilaterally Heart: 3/6 apical systolic murmur consistent with mild regurgitation. Extremities: extremities normal, atraumatic, no cyanosis or edema Pulses: 2+ and symmetric Skin: Skin color, texture, turgor normal. No rashes or lesions Neurologic: Grossly normal  EKG sinus bradycardia 58 with occasional PVCs and sinus arrhythmia.  I personally reviewed this EKG.  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  EF was normal.  LV Cavity size was mildly dilated with an end-diastolic dimension of 58 mm.  He is still asymptomatic.  We will recheck an echo in July.  Hyperlipidemia History of hyperlipidemia on statin therapy with lipid profile performed 03/29/2022 revealing total cholesterol 107, LDL 52 and HDL 41.  Elevated coronary artery calcium score Coronary calcium score performed 05/31/2021 was 43 all of which were in the RCA.  His LDL is at goal for secondary prevention.  He is asymptomatic.     Lorretta Harp MD FACP,FACC,FAHA, [facility] 02/24/2023 8:51 AM

## 2023-02-24 NOTE — Assessment & Plan Note (Signed)
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  EF was normal.  LV Cavity size was mildly dilated with an end-diastolic dimension of 58 mm.  He is still asymptomatic.  We will recheck an echo in July.

## 2023-02-27 NOTE — Telephone Encounter (Signed)
Terry Harp, MD  Sharee Holster, RN3 days ago    His blood pressure in the office was normal.  Not really sure why he was started on amlodipine.   Left message for wife to call the office back.

## 2023-03-02 DIAGNOSIS — G4719 Other hypersomnia: Secondary | ICD-10-CM | POA: Diagnosis not present

## 2023-03-10 ENCOUNTER — Other Ambulatory Visit: Payer: Self-pay | Admitting: Endocrinology

## 2023-03-26 ENCOUNTER — Other Ambulatory Visit: Payer: Self-pay | Admitting: Acute Care

## 2023-03-26 DIAGNOSIS — Z122 Encounter for screening for malignant neoplasm of respiratory organs: Secondary | ICD-10-CM

## 2023-03-26 DIAGNOSIS — Z87891 Personal history of nicotine dependence: Secondary | ICD-10-CM

## 2023-03-26 DIAGNOSIS — F17210 Nicotine dependence, cigarettes, uncomplicated: Secondary | ICD-10-CM

## 2023-05-01 ENCOUNTER — Ambulatory Visit (HOSPITAL_COMMUNITY)
Admission: RE | Admit: 2023-05-01 | Discharge: 2023-05-01 | Disposition: A | Discharge status: Home / Self Care | Payer: PPO | Source: Ambulatory Visit | Attending: Acute Care | Admitting: Acute Care

## 2023-05-01 DIAGNOSIS — Z87891 Personal history of nicotine dependence: Secondary | ICD-10-CM | POA: Insufficient documentation

## 2023-05-01 DIAGNOSIS — Z122 Encounter for screening for malignant neoplasm of respiratory organs: Secondary | ICD-10-CM | POA: Diagnosis not present

## 2023-05-01 DIAGNOSIS — F17210 Nicotine dependence, cigarettes, uncomplicated: Secondary | ICD-10-CM | POA: Insufficient documentation

## 2023-05-05 ENCOUNTER — Other Ambulatory Visit: Payer: Self-pay | Admitting: Acute Care

## 2023-05-05 DIAGNOSIS — Z122 Encounter for screening for malignant neoplasm of respiratory organs: Secondary | ICD-10-CM

## 2023-05-05 DIAGNOSIS — Z87891 Personal history of nicotine dependence: Secondary | ICD-10-CM

## 2023-05-05 DIAGNOSIS — F17210 Nicotine dependence, cigarettes, uncomplicated: Secondary | ICD-10-CM

## 2023-06-14 DIAGNOSIS — Z125 Encounter for screening for malignant neoplasm of prostate: Secondary | ICD-10-CM | POA: Diagnosis not present

## 2023-06-14 DIAGNOSIS — R351 Nocturia: Secondary | ICD-10-CM | POA: Diagnosis not present

## 2023-06-14 DIAGNOSIS — N401 Enlarged prostate with lower urinary tract symptoms: Secondary | ICD-10-CM | POA: Diagnosis not present

## 2023-06-14 DIAGNOSIS — N5201 Erectile dysfunction due to arterial insufficiency: Secondary | ICD-10-CM | POA: Diagnosis not present

## 2023-06-16 ENCOUNTER — Other Ambulatory Visit: Payer: PPO

## 2023-06-16 DIAGNOSIS — E890 Postprocedural hypothyroidism: Secondary | ICD-10-CM

## 2023-06-16 NOTE — Addendum Note (Signed)
Addended by: Onyx Edgley C on: 06/16/2023 03:19 PM   Modules accepted: Orders  

## 2023-06-16 NOTE — Addendum Note (Signed)
Addended by: Ilda Foil on: 06/16/2023 03:19 PM   Modules accepted: Orders

## 2023-06-17 LAB — T4, FREE: Free T4: 1.1 ng/dL (ref 0.8–1.8)

## 2023-06-17 LAB — TSH: TSH: 2.14 mIU/L (ref 0.40–4.50)

## 2023-06-19 ENCOUNTER — Ambulatory Visit: Payer: PPO | Admitting: Endocrinology

## 2023-06-19 ENCOUNTER — Encounter: Payer: Self-pay | Admitting: Endocrinology

## 2023-06-19 VITALS — BP 124/60 | HR 47 | Ht 70.0 in | Wt 190.8 lb

## 2023-06-19 DIAGNOSIS — E890 Postprocedural hypothyroidism: Secondary | ICD-10-CM

## 2023-06-19 NOTE — Progress Notes (Signed)
Patient ID: Terry Miranda, male   DOB: 01-07-1951, 72 y.o.   MRN: [id number]    Reason for Appointment:  Post ablative hypothyroidism  followup visit   History of Present Illness:   Hyperthyroidism was first diagnosed in 11/2012 Initial symptoms at diagnosis were shakiness, some fatigue and feeling warm. Also had lost 20 pounds  Treatment in 12/2012 was with 5 mg methimazole twice a day. He had good results with this and the medication was tapered off and stopped in 5/14. Because of relapsing hyperthyroidism in 6/14 with minimal symptoms  he was started back on methimazole 5 mg daily Subsequently with stopping his medication in 11/14 he relapsed with his hyperthyroidism and was restarted on methimazole in 12/14 Since he was not able to wean off his methimazole again he was given 15.9 mCi of I-131 on 03/20/14  He developed  hypothyroidism and was started on Synthroid 112 mcg on 04/24/14 This dose was reduced in 6/15 and is since then taking 88 g daily  He had been taking the 88 mcg generic levothyroxine previously Since 6/23 he has been taking 100 mcg because of a high TSH of 6.1 without change in compliance with his dosage  Did not feel any different on the dosage change  He feels fairly good with no complaints of fatigue or lethargy No cold intolerance  Weight is about the same recently  He is consistent with taking his levothyroxine 30 minutes before breakfast daily   TSH which was higher than usual at 6.1 in 6/23 and is now consistently back to normal at about 2        Wt Readings from Last 3 Encounters:  06/19/23 190 lb 12.8 oz (86.5 kg)  02/24/23 190 lb 6.4 oz (86.4 kg)  09/12/22 195 lb 6.4 oz (88.6 kg)   LABS:  Lab Results  Component Value Date   TSH 2.14 06/16/2023   TSH 2.26 09/08/2022   TSH 6.060 (H) 06/03/2022   FREET4 1.1 06/16/2023   FREET4 0.94 09/08/2022   FREET4 1.28 06/03/2022     Allergies as of 06/19/2023       Reactions   Amoxicillin     REACTION: face swelled   Fish Oil Other (See Comments)   Ibuprofen Swelling   Face swell   Saw Palmetto Other (See Comments)        Medication List        Accurate as of June 19, 2023  4:31 PM. If you have any questions, ask your nurse or doctor.          acetaminophen 500 MG tablet Commonly known as: TYLENOL Take 500 mg by mouth every 6 (six) hours as needed.   atorvastatin 40 MG tablet Commonly known as: LIPITOR Take 1 tablet (40 mg total) by mouth daily.   cholecalciferol 1000 units tablet Commonly known as: VITAMIN D Take 2,000 Units by mouth daily. 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 He has been smoking an average of 1 pack per day. He has never used smokeless tobacco. He reports current alcohol use. He reports that he does not use drugs.  Allergies:  Allergies  Allergen Reactions   Amoxicillin     REACTION: face swelled   Fish Oil Other (See Comments)   Ibuprofen Swelling    Face swell   Saw Palmetto Other (See Comments)    Review of Systems:  Has history of low pulse rate followed by cardiology   Has previous history of  hypercholesterolemia       Lab Results  Component Value Date   CHOL 193 05/28/2021   Lab Results  Component Value Date   HDL 40.10 05/28/2021   Lab Results  Component Value Date   LDLCALC 130 (H) 05/28/2021   Lab Results  Component Value Date   TRIG 111.0 05/28/2021   Lab Results  Component Value Date   CHOLHDL 5 05/28/2021   No results found for: "LDLDIRECT"    Examination:   BP 124/60 (BP Location: Left Arm, Patient Position: Sitting)   Pulse (!) 47   Ht 5\' 10"  (1.778 m)   Wt 190 lb 12.8 oz (86.5 kg)   SpO2 96%   BMI 27.38 kg/m    Assessment/Plan:  Post ablative hypothyroidism:  He has been taking 100 mcg levothyroxine since June 2023 Subjectively doing well  Taking his levothyroxine before breakfast as directed daily  TSH again quite normal at about 2  He will continue 100 mcg and come back annually for follow-up  Reather Littler 06/19/2023, 4:31 PM

## 2023-06-20 ENCOUNTER — Ambulatory Visit (HOSPITAL_COMMUNITY): Payer: PPO | Attending: Cardiovascular Disease

## 2023-06-20 DIAGNOSIS — I340 Nonrheumatic mitral (valve) insufficiency: Secondary | ICD-10-CM | POA: Diagnosis not present

## 2023-06-20 DIAGNOSIS — E785 Hyperlipidemia, unspecified: Secondary | ICD-10-CM | POA: Diagnosis not present

## 2023-06-20 LAB — ECHOCARDIOGRAM COMPLETE
Area-P 1/2: 3.54 cm2
MV M vel: 5.73 m/s
MV Peak grad: 131.3 mmHg
MV VTI: 1.49 cm2
P 1/2 time: 784 msec
S' Lateral: 3.4 cm

## 2023-09-19 DIAGNOSIS — E7849 Other hyperlipidemia: Secondary | ICD-10-CM | POA: Diagnosis not present

## 2023-09-19 DIAGNOSIS — N400 Enlarged prostate without lower urinary tract symptoms: Secondary | ICD-10-CM | POA: Diagnosis not present

## 2023-09-19 DIAGNOSIS — M545 Low back pain, unspecified: Secondary | ICD-10-CM | POA: Insufficient documentation

## 2023-09-19 DIAGNOSIS — J449 Chronic obstructive pulmonary disease, unspecified: Secondary | ICD-10-CM | POA: Diagnosis not present

## 2023-09-19 DIAGNOSIS — I10 Essential (primary) hypertension: Secondary | ICD-10-CM | POA: Diagnosis not present

## 2023-09-19 DIAGNOSIS — M25551 Pain in right hip: Secondary | ICD-10-CM | POA: Diagnosis not present

## 2023-09-19 DIAGNOSIS — I700 Atherosclerosis of aorta: Secondary | ICD-10-CM | POA: Diagnosis not present

## 2023-09-19 DIAGNOSIS — Z23 Encounter for immunization: Secondary | ICD-10-CM | POA: Diagnosis not present

## 2023-09-19 DIAGNOSIS — M5030 Other cervical disc degeneration, unspecified cervical region: Secondary | ICD-10-CM | POA: Diagnosis not present

## 2023-09-19 DIAGNOSIS — J432 Centrilobular emphysema: Secondary | ICD-10-CM | POA: Diagnosis not present

## 2023-09-19 DIAGNOSIS — M25552 Pain in left hip: Secondary | ICD-10-CM | POA: Diagnosis not present

## 2023-09-19 DIAGNOSIS — F17200 Nicotine dependence, unspecified, uncomplicated: Secondary | ICD-10-CM | POA: Diagnosis not present

## 2023-09-19 DIAGNOSIS — E890 Postprocedural hypothyroidism: Secondary | ICD-10-CM | POA: Diagnosis not present

## 2023-09-19 DIAGNOSIS — I349 Nonrheumatic mitral valve disorder, unspecified: Secondary | ICD-10-CM | POA: Diagnosis not present

## 2023-09-21 DIAGNOSIS — H43812 Vitreous degeneration, left eye: Secondary | ICD-10-CM | POA: Diagnosis not present

## 2023-10-16 DIAGNOSIS — M51369 Other intervertebral disc degeneration, lumbar region without mention of lumbar back pain or lower extremity pain: Secondary | ICD-10-CM | POA: Insufficient documentation

## 2023-10-16 DIAGNOSIS — E785 Hyperlipidemia, unspecified: Secondary | ICD-10-CM | POA: Diagnosis not present

## 2023-10-16 DIAGNOSIS — I700 Atherosclerosis of aorta: Secondary | ICD-10-CM | POA: Diagnosis not present

## 2023-10-31 DIAGNOSIS — M545 Low back pain, unspecified: Secondary | ICD-10-CM | POA: Diagnosis not present

## 2023-10-31 DIAGNOSIS — M25551 Pain in right hip: Secondary | ICD-10-CM | POA: Diagnosis not present

## 2023-11-14 DIAGNOSIS — M545 Low back pain, unspecified: Secondary | ICD-10-CM | POA: Diagnosis not present

## 2023-11-14 DIAGNOSIS — M25551 Pain in right hip: Secondary | ICD-10-CM | POA: Diagnosis not present

## 2023-11-16 DIAGNOSIS — M25551 Pain in right hip: Secondary | ICD-10-CM | POA: Diagnosis not present

## 2023-11-16 DIAGNOSIS — M545 Low back pain, unspecified: Secondary | ICD-10-CM | POA: Diagnosis not present

## 2023-11-20 IMAGING — CT CT CHEST LUNG CANCER SCREENING LOW DOSE W/O CM
2 of 4 series · 15 of 36 positions shown, 18 images · non-contrast
Comparison: Low-dose lung cancer screening CT chest dated
04/26/2021

CLINICAL DATA: 70-year-old male current smoker, with 40 pack-year
history of smoking, for follow-up lung cancer screening



[Series 3: lung thins 1.0 · axial · 0.71mm/px · z∈[-334,-28]mm · 12 of 338 slices shown, 15 images]
[im 16/338  mediastinal]
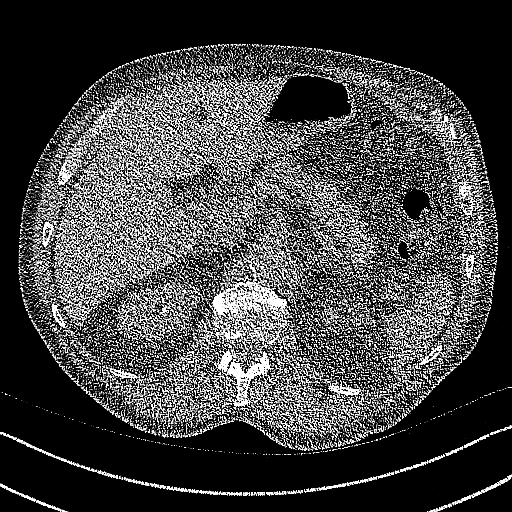
[im 16/338  lung]
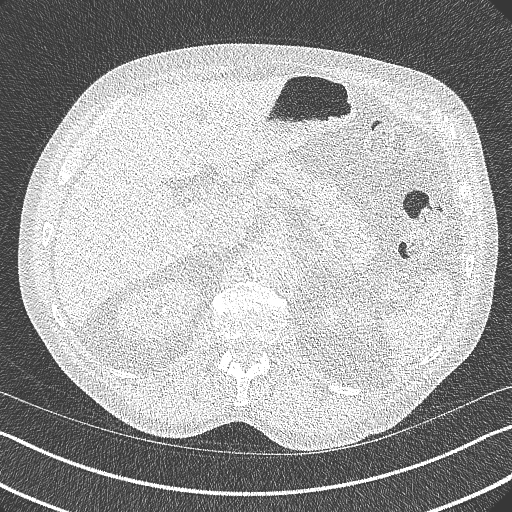
[im 46/338  lung]
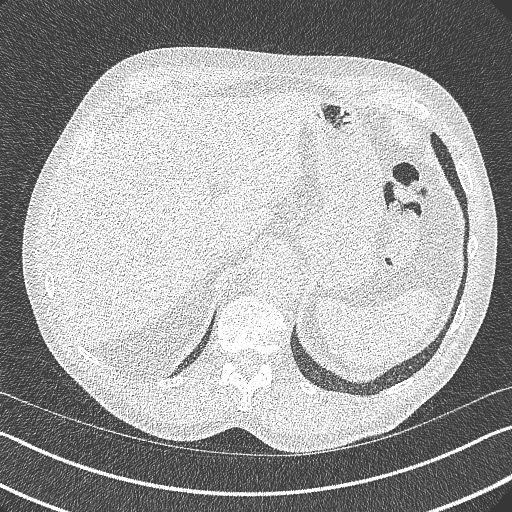
[im 77/338  lung]
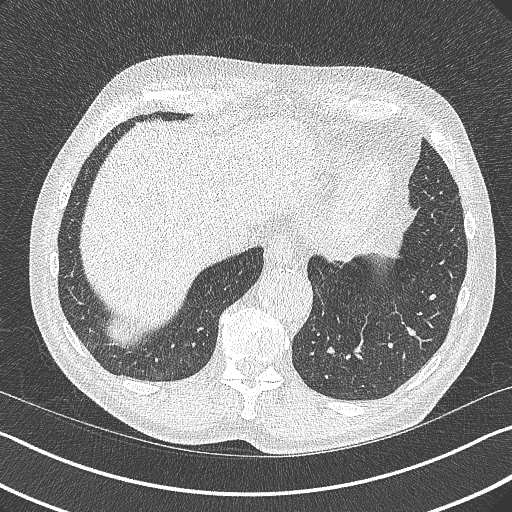
[im 108/338  lung]
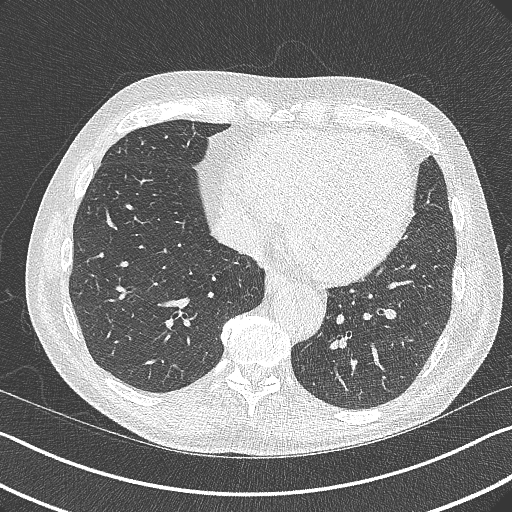
[im 123/338  mediastinal]
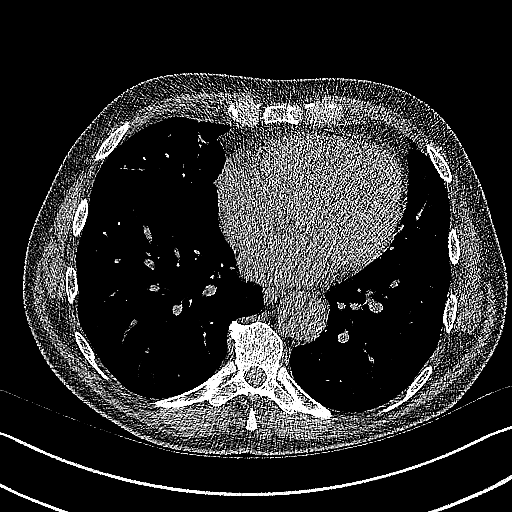
[im 123/338  lung]
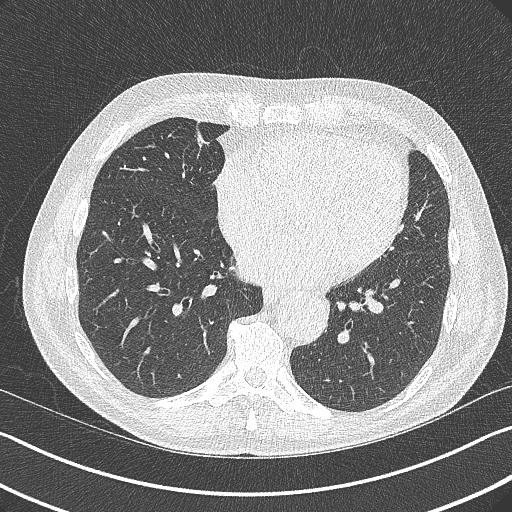
[im 154/338  lung]
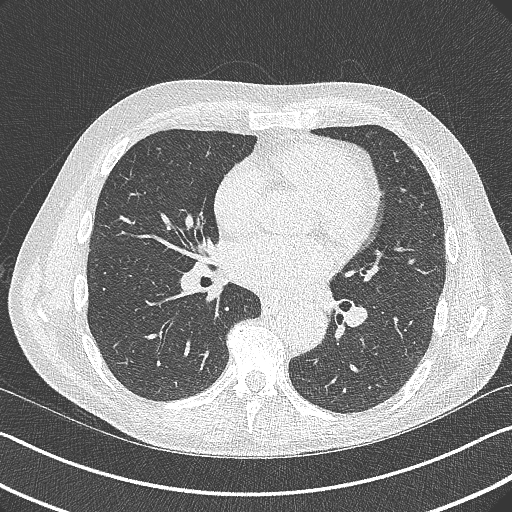
[im 184/338  lung]
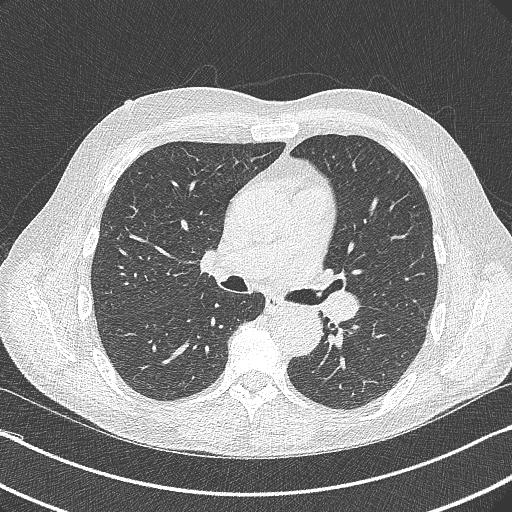
[im 215/338  lung]
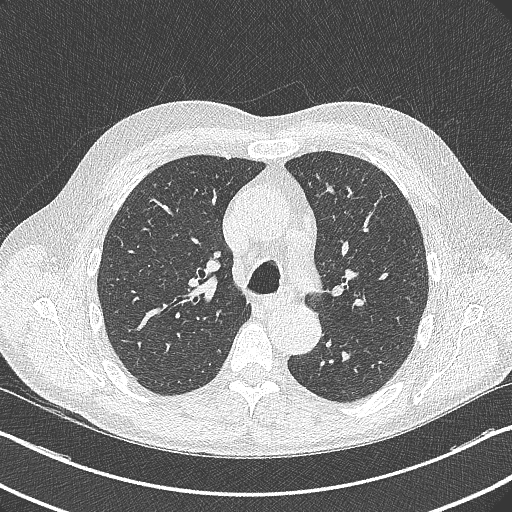
[im 230/338  mediastinal]
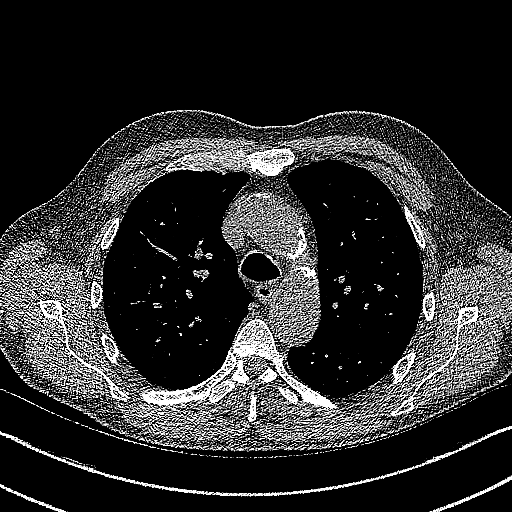
[im 230/338  lung]
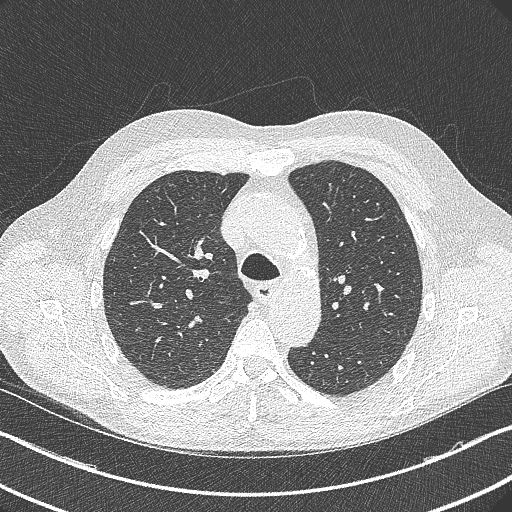
[im 261/338  lung]
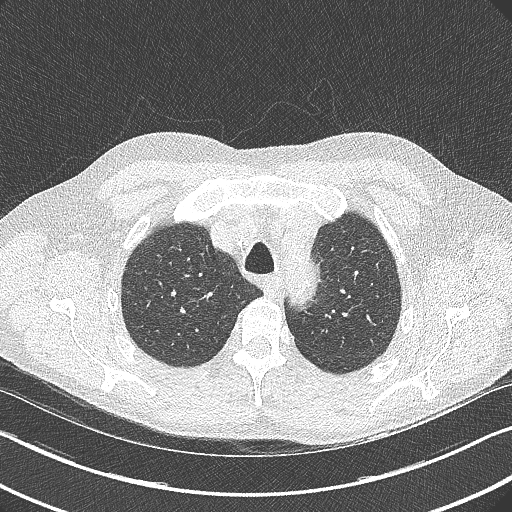
[im 292/338  lung]
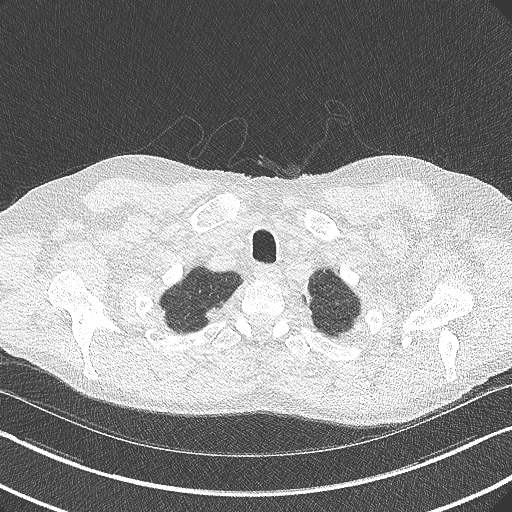
[im 322/338  lung]
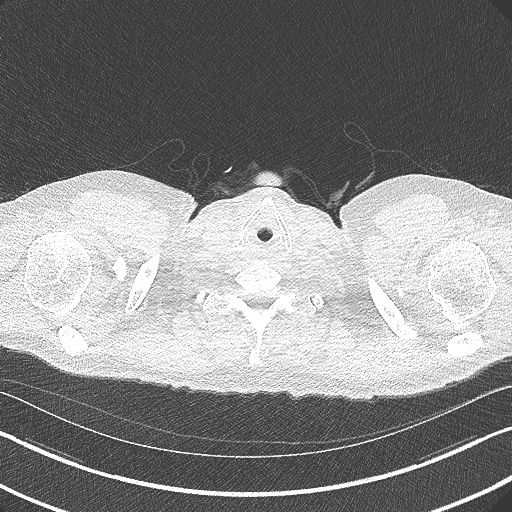

[Series 5: coronal · coronal · 0.63mm/px · 3 of 120 slices shown]
[im 24/120  lung]
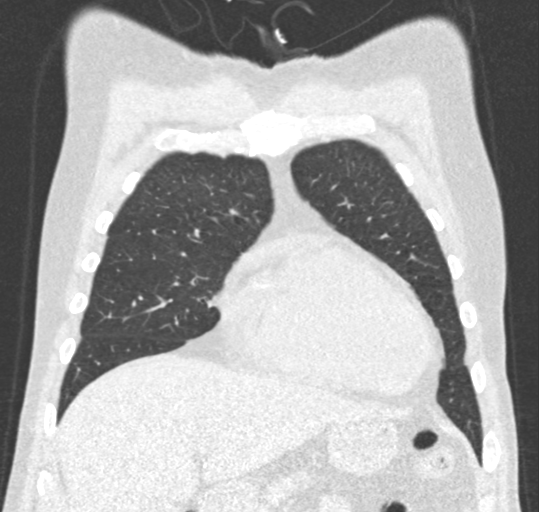
[im 48/120  lung]
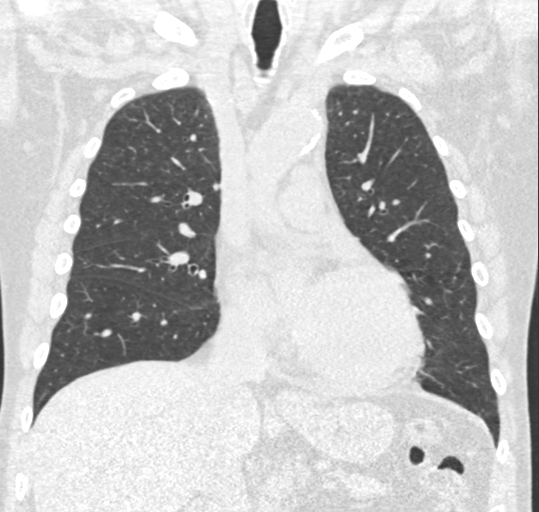
[im 72/120  lung]
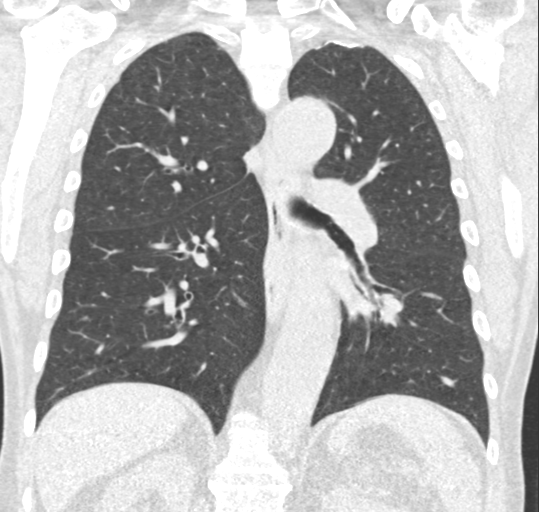

[15 of 36 positions shown; findings below may reference images not displayed]

FINDINGS: Cardiovascular: The heart is normal in size. No pericardial
effusion.

No evidence of thoracic aortic aneurysm. Atherosclerotic
calcifications of the aortic arch.

Mild coronary atherosclerosis of the right coronary artery.

Mediastinum/Nodes: No suspicious mediastinal lymphadenopathy.

Visualized thyroid is unremarkable.

Lungs/Pleura: Mild centrilobular and paraseptal emphysematous
changes, upper lung predominant.

3.5 mm right upper lobe nodule, unchanged.

No focal consolidation.

No pleural effusion or pneumothorax.

Upper Abdomen: Visualized upper abdomen is notable for
cholelithiasis, a 6 mm nodule in right upper pole renal calculus,
and vascular calcifications.

Musculoskeletal: Degenerative changes of the visualized
thoracolumbar spine.
IMPRESSION: Lung-RADS 2, benign appearance or behavior. Continue annual
screening with low-dose chest CT without contrast in 12 months.

Aortic Atherosclerosis (GWR7D-OES.S) and Emphysema (GWR7D-UGY.V).

## 2023-11-21 DIAGNOSIS — M25551 Pain in right hip: Secondary | ICD-10-CM | POA: Diagnosis not present

## 2023-11-21 DIAGNOSIS — M545 Low back pain, unspecified: Secondary | ICD-10-CM | POA: Diagnosis not present

## 2023-11-23 DIAGNOSIS — M25551 Pain in right hip: Secondary | ICD-10-CM | POA: Diagnosis not present

## 2023-11-23 DIAGNOSIS — M545 Low back pain, unspecified: Secondary | ICD-10-CM | POA: Diagnosis not present

## 2023-11-30 DIAGNOSIS — M25551 Pain in right hip: Secondary | ICD-10-CM | POA: Diagnosis not present

## 2023-11-30 DIAGNOSIS — M545 Low back pain, unspecified: Secondary | ICD-10-CM | POA: Diagnosis not present

## 2023-12-07 DIAGNOSIS — M545 Low back pain, unspecified: Secondary | ICD-10-CM | POA: Diagnosis not present

## 2023-12-07 DIAGNOSIS — M25551 Pain in right hip: Secondary | ICD-10-CM | POA: Diagnosis not present

## 2024-03-01 DIAGNOSIS — H2512 Age-related nuclear cataract, left eye: Secondary | ICD-10-CM | POA: Diagnosis not present

## 2024-03-01 DIAGNOSIS — H2511 Age-related nuclear cataract, right eye: Secondary | ICD-10-CM | POA: Diagnosis not present

## 2024-03-11 ENCOUNTER — Other Ambulatory Visit: Payer: Self-pay

## 2024-03-11 DIAGNOSIS — E890 Postprocedural hypothyroidism: Secondary | ICD-10-CM

## 2024-03-11 MED ORDER — LEVOTHYROXINE SODIUM 100 MCG PO TABS: 100.0000 ug | ORAL_TABLET | Freq: Every day | ORAL | 2 refills | Status: DC

## 2024-03-15 DIAGNOSIS — H25012 Cortical age-related cataract, left eye: Secondary | ICD-10-CM | POA: Diagnosis not present

## 2024-03-15 DIAGNOSIS — H25042 Posterior subcapsular polar age-related cataract, left eye: Secondary | ICD-10-CM | POA: Diagnosis not present

## 2024-03-15 DIAGNOSIS — H2512 Age-related nuclear cataract, left eye: Secondary | ICD-10-CM | POA: Diagnosis not present

## 2024-04-17 ENCOUNTER — Ambulatory Visit: Attending: Cardiovascular Disease | Admitting: Cardiovascular Disease

## 2024-04-17 ENCOUNTER — Encounter: Payer: Self-pay | Admitting: Cardiovascular Disease

## 2024-04-17 VITALS — BP 128/76 | HR 59 | Ht 70.0 in | Wt 190.2 lb

## 2024-04-17 DIAGNOSIS — R931 Abnormal findings on diagnostic imaging of heart and coronary circulation: Secondary | ICD-10-CM

## 2024-04-17 DIAGNOSIS — Z720 Tobacco use: Secondary | ICD-10-CM | POA: Insufficient documentation

## 2024-04-17 DIAGNOSIS — E785 Hyperlipidemia, unspecified: Secondary | ICD-10-CM

## 2024-04-17 DIAGNOSIS — R001 Bradycardia, unspecified: Secondary | ICD-10-CM

## 2024-04-17 DIAGNOSIS — I340 Nonrheumatic mitral (valve) insufficiency: Secondary | ICD-10-CM

## 2024-04-17 DIAGNOSIS — R011 Cardiac murmur, unspecified: Secondary | ICD-10-CM

## 2024-04-17 NOTE — Assessment & Plan Note (Signed)
 Elevated coronary calcium  score of 43 measured 05/31/2021 all in the RCA.  He is at goal for secondary prevention.  He denies chest pain.

## 2024-04-17 NOTE — Assessment & Plan Note (Signed)
 History of hyperlipidemia on atorvastatin  lipid profile performed 09/19/2023 revealing total cholesterol 117, LDL 59 HDL 44.

## 2024-04-17 NOTE — Assessment & Plan Note (Signed)
 Terry Miranda returns today for follow-up.  Since I saw him a year ago he had progressive dyspnea on exertion.  His echocardiogram performed 06/20/2023 showed normal LV systolic function with mildly dilated LV.  He had severe MR which had progressed compared to his prior echo.  He is now at the point where we need to consider mitral valve repair and/or replacement.  I am going to arrange for him to undergo TEE and right left heart cath as an outpatient and if he is repairable we will send him to [facility] to be evaluated by Dr.Gaca.

## 2024-04-17 NOTE — Progress Notes (Unsigned)
 04/17/2024 FREDRICO PASSON   1951-02-12  [id number]  Primary Physician Sinda Duel, PA Primary Cardiologist: Avanell Leigh MD Bennye Bravo, [place]  HPI:  Terry Miranda is a 73 y.o. thin appearing married Caucasian male father of 1 child with one grandchild on the way who is wife Georgia  is also a patient of mine. He was referred by his primary care provider, Barnet Lias PA-C, for evaluation of asymptomatic bradycardia.  I last saw him in the office 02/24/2023.  He was found to have a "slow heart rate" by his occupational nurse at work. Does have a heart rate in the 50s. Has been told that he has a cardiac murmur. His risk factors include 50 pack years of tobacco abuse currently smoking 1 pack/day recalcitrant to risk factor modification. He has no other cardiac risk factors. Is never had a heart attack or stroke. He denies chest pain or shortness of breath. He does walk at work but does do organized exercise otherwise.   Since I saw him in the office a year ago he has developed progressive dyspnea on exertion over the last 3 months.  His echo performed a year ago did show normal LV systolic function, mild LV dilatation with severe MR and severe left atrial enlargement.  I think at this time he would benefit from mitral valve repair if possible.  I am referring him to the hospital for TEE/right left heart cath after which we will decide whether or not to refer him out for minimally invasive mitral valve repair.   Current Meds  Medication Sig   acetaminophen  (TYLENOL ) 500 MG tablet Take 500 mg by mouth every 6 (six) hours as needed.   cholecalciferol (VITAMIN D) 1000 UNITS tablet Take 2,000 Units by mouth daily. Two tablets once a day   levothyroxine  (SYNTHROID ) 100 MCG tablet Take 1 tablet (100 mcg total) by mouth daily.   tamsulosin (FLOMAX) 0.4 MG CAPS capsule TAKE 1 CAPSULE BY MOUTH EVERY DAY     Allergies  Allergen Reactions   Amoxicillin     REACTION: face swelled   Fish Oil  Other (See Comments)   Saw Palmetto Other (See Comments)    Social History   Socioeconomic History   Marital status: Married    Spouse name: Not on file   Number of children: Not on file   Years of education: Not on file   Highest education level: Not on file  Occupational History   Not on file  Tobacco Use   Smoking status: Every Day    Current packs/day: 1.00    Types: Cigarettes   Smokeless tobacco: Never  Substance and Sexual Activity   Alcohol use: Yes    Comment: rare   Drug use: No   Sexual activity: Not on file  Other Topics Concern   Not on file  Social History Narrative   Not on file   Social Drivers of Health   Financial Resource Strain: Not on file  Food Insecurity: Not on file  Transportation Needs: Not on file  Physical Activity: Not on file  Stress: Not on file  Social Connections: Not on file  Intimate Partner Violence: Not on file     Review of Systems: General: negative for chills, fever, night sweats or weight changes.  Cardiovascular: negative for chest pain, dyspnea on exertion, edema, orthopnea, palpitations, paroxysmal nocturnal dyspnea or shortness of breath Dermatological: negative for rash Respiratory: negative for cough or wheezing Urologic: negative for  hematuria Abdominal: negative for nausea, vomiting, diarrhea, bright red blood per rectum, melena, or hematemesis Neurologic: negative for visual changes, syncope, or dizziness All other systems reviewed and are otherwise negative except as noted above.    Blood pressure 128/76, pulse (!) 59, height 5\' 10"  (1.778 m), weight 190 lb 3.2 oz (86.3 kg), SpO2 98%.  General appearance: alert and no distress Neck: no adenopathy, no carotid bruit, no JVD, supple, symmetrical, trachea midline, and thyroid  not enlarged, symmetric, no tenderness/mass/nodules Lungs: clear to auscultation bilaterally Heart: High-pitched apical systolic murmur consistent with mitral regurgitation. Extremities:  extremities normal, atraumatic, no cyanosis or edema Pulses: 2+ and symmetric Skin: Skin color, texture, turgor normal. No rashes or lesions Neurologic: Grossly normal  EKG EKG Interpretation Date/Time:  Wednesday Apr 17 2024 16:09:59 EDT Ventricular Rate:  59 PR Interval:  192 QRS Duration:  92 QT Interval:  438 QTC Calculation: 433 R Axis:   62  Text Interpretation: Sinus bradycardia with Premature atrial complexes Possible Anterior infarct , age undetermined When compared with ECG of 29-Dec-2022 08:34, PREVIOUS ECG IS PRESENT Confirmed by Lauro Portal [phone]) on 04/17/2024 4:29:13 PM    ASSESSMENT AND PLAN:   Moderate mitral regurgitation Mr. Lumpkin returns today for follow-up.  Since I saw him a year ago he had progressive dyspnea on exertion.  His echocardiogram performed 06/20/2023 showed normal LV systolic function with mildly dilated LV.  He had severe MR which had progressed compared to his prior echo.  He is now at the point where we need to consider mitral valve repair and/or replacement.  I am going to arrange for him to undergo TEE and right left heart cath as an outpatient and if he is repairable we will send him to [facility] to be evaluated by Dr.Gaca.   Hyperlipidemia History of hyperlipidemia on atorvastatin  lipid profile performed 09/19/2023 revealing total cholesterol 117, LDL 59 HDL 44.  Elevated coronary artery calcium  score Elevated coronary calcium  score of 43 measured 05/31/2021 all in the RCA.  He is at goal for secondary prevention.  He denies chest pain.  Tobacco abuse Ongoing tobacco abuse of 1 pack/day recalcitrant to resector modification.  We discussed the importance of smoking cessation.     Avanell Leigh MD FACP,FACC,FAHA, [facility] 04/17/2024 4:39 PM

## 2024-04-17 NOTE — Assessment & Plan Note (Signed)
 Ongoing tobacco abuse of 1 pack/day recalcitrant to resector modification.  We discussed the importance of smoking cessation.

## 2024-04-17 NOTE — Patient Instructions (Addendum)
 Medication Instructions:  Your physician recommends that you continue on your current medications as directed. Please refer to the Current Medication list given to you today.  *If you need a refill on your cardiac medications before your next appointment, please call your pharmacy*  Lab Work: Your physician recommends that you have labs drawn today: BMET & CBC  If you have labs (blood work) drawn today and your tests are completely normal, you will receive your results only by: MyChart Message (if you have MyChart) OR A paper copy in the mail If you have any lab test that is abnormal or we need to change your treatment, we will call you to review the results.  Testing/Procedures: See below  Follow-Up: At [facility], you and your health needs are our priority.  As part of our continuing mission to provide you with exceptional heart care, our providers are all part of one team.  This team includes your primary Cardiologist (physician) and Advanced Practice Providers or APPs (Physician Assistants and Nurse Practitioners) who all work together to provide you with the care you need, when you need it.  Your next appointment:   2-3 week(s) after procedures   Provider:   Lauro Portal, MD    We recommend signing up for the patient portal called "MyChart".  Sign up information is provided on this After Visit Summary.  MyChart is used to connect with patients for Virtual Visits (Telemedicine).  Patients are able to view lab/test results, encounter notes, upcoming appointments, etc.  Non-urgent messages can be sent to your provider as well.   To learn more about what you can do with MyChart, go to [email or web address].   Other Instructions      Dear Terry Miranda  You are scheduled for a TEE (Transesophageal Echocardiogram) on Friday, May 16 with Dr. Paulita Boss.  Please arrive at the [facility] (Main Entrance A) at [facility]: [address] [place], [place]  [address] at 6:30 AM (This time is 1 hour(s) before your procedure to ensure your preparation).   You are scheduled for a Cardiac Catheterization on Friday, May 16 with Dr. Arnoldo Lapping.  Please arrive at the [facility] (Main Entrance A) at [facility]: [address] [place], [place] [address] at 6:30 AM (This time is 2.5 hour(s) before your procedure to ensure your preparation).   Free valet parking service is available. You will check in at ADMITTING. The support person will be asked to wait in the waiting room.  It is OK to have someone drop you off and come back when you are ready to be discharged.    *Please Note: You will receive a call the day before your procedure to confirm the appointment time. That time may have changed from the original time based on the schedule for that day.*    DIET:  Nothing to eat or drink after midnight except a sip of water with medications (see medication instructions below)  MEDICATION INSTRUCTIONS: !!IF ANY NEW MEDICATIONS ARE STARTED AFTER TODAY, PLEASE NOTIFY YOUR PROVIDER AS SOON AS POSSIBLE!!  FYI: Medications such as Semaglutide (Ozempic, [place]), Tirzepatide (Mounjaro, Zepbound), Dulaglutide (Trulicity), etc ("GLP1 agonists") AND Canagliflozin (Invokana), Dapagliflozin (Farxiga), Empagliflozin (Jardiance), Ertugliflozin (Steglatro), Bexagliflozin [employer]) or any combination with one of these drugs such as Invokamet (Canagliflozin/Metformin), Synjardy (Empagliflozin/Metformin), etc ("SGLT2 inhibitors") must be held around the time of a procedure. This is not a comprehensive list of all of these drugs. Please review all of your medications and  talk to your provider if you take any one of these. If you are not sure, ask your provider.  LABS:   Come to the lab at the [facility] ([address] [place], 1st Floor) between the hours of 8:00 am and 4:30 pm. You do NOT have to be fasting.  FYI:  For  your safety, and to allow us  to monitor your vital signs accurately during the surgery/procedure we request: If you have artificial nails, gel coating, SNS etc, please have those removed prior to your surgery/procedure. Not having the nail coverings /polish removed may result in cancellation or delay of your surgery/procedure.  Your support person will be asked to wait in the waiting room during your procedure.  It is OK to have someone drop you off and come back when you are ready to be discharged.  You cannot drive after the procedure and will need someone to drive you home.   Plan to go home the same day, you will only stay overnight if medically necessary.  Bring a current list of your medications and current insurance cards.  You MUST have a responsible person to drive you home.  Someone MUST be with you the first 24 hours after you arrive home or your discharge will be delayed.  Please wear clothes that are easy to get on and off and wear slip-on shoes.  *Special Note: Every effort is made to have your procedure done on time. Occasionally there are emergencies that occur at the hospital that may cause delays. Please be patient if a delay does occur.

## 2024-04-18 ENCOUNTER — Telehealth: Payer: Self-pay

## 2024-04-18 NOTE — Telephone Encounter (Signed)
 Left message for pt to call back to schedule follow up appointment with Dr. Katheryne Pane after procedures. Pt is having TEE and R/L heart cath on 5/16.

## 2024-04-23 ENCOUNTER — Telehealth: Payer: Self-pay | Admitting: *Deleted

## 2024-04-23 DIAGNOSIS — I340 Nonrheumatic mitral (valve) insufficiency: Secondary | ICD-10-CM | POA: Diagnosis not present

## 2024-04-23 NOTE — Telephone Encounter (Signed)
 Cardiac Catheterization scheduled at [facility] for: Friday Apr 26, 2024 9 AM/TEE 7:30 AM Arrival time [facility] Main Entrance A at: 6:30 AM  Nothing to eat or drink after midnight prior to procedures.  Medication instructions: -Usual morning medications can be taken with sips of water including aspirin 81 mg.  Plan to go home the same day, you will only stay overnight if medically necessary.  You must have responsible adult to drive you home.  Someone must be with you the first 24 hours after you arrive home.  Reviewed procedure instructions with patient. Patient tells me he plans to get BMP/CBC today at [facility].

## 2024-04-24 ENCOUNTER — Observation Stay (HOSPITAL_COMMUNITY)
Admission: EM | Admit: 2024-04-24 | Discharge: 2024-04-25 | Disposition: A | Discharge status: Home / Self Care | Attending: Internal Medicine | Admitting: Internal Medicine

## 2024-04-24 ENCOUNTER — Ambulatory Visit: Payer: Self-pay | Admitting: Cardiovascular Disease

## 2024-04-24 ENCOUNTER — Other Ambulatory Visit: Payer: Self-pay

## 2024-04-24 ENCOUNTER — Encounter (HOSPITAL_COMMUNITY): Payer: Self-pay

## 2024-04-24 ENCOUNTER — Telehealth: Payer: Self-pay | Admitting: *Deleted

## 2024-04-24 DIAGNOSIS — I25810 Atherosclerosis of coronary artery bypass graft(s) without angina pectoris: Secondary | ICD-10-CM | POA: Insufficient documentation

## 2024-04-24 DIAGNOSIS — I2584 Coronary atherosclerosis due to calcified coronary lesion: Secondary | ICD-10-CM | POA: Insufficient documentation

## 2024-04-24 DIAGNOSIS — E039 Hypothyroidism, unspecified: Secondary | ICD-10-CM | POA: Insufficient documentation

## 2024-04-24 DIAGNOSIS — E785 Hyperlipidemia, unspecified: Secondary | ICD-10-CM | POA: Diagnosis present

## 2024-04-24 DIAGNOSIS — I700 Atherosclerosis of aorta: Secondary | ICD-10-CM | POA: Diagnosis present

## 2024-04-24 DIAGNOSIS — D509 Iron deficiency anemia, unspecified: Secondary | ICD-10-CM | POA: Diagnosis present

## 2024-04-24 DIAGNOSIS — D519 Vitamin B12 deficiency anemia, unspecified: Secondary | ICD-10-CM | POA: Diagnosis not present

## 2024-04-24 DIAGNOSIS — D649 Anemia, unspecified: Principal | ICD-10-CM | POA: Diagnosis present

## 2024-04-24 DIAGNOSIS — F17210 Nicotine dependence, cigarettes, uncomplicated: Secondary | ICD-10-CM | POA: Diagnosis not present

## 2024-04-24 DIAGNOSIS — Z720 Tobacco use: Secondary | ICD-10-CM | POA: Diagnosis present

## 2024-04-24 DIAGNOSIS — R7989 Other specified abnormal findings of blood chemistry: Secondary | ICD-10-CM | POA: Diagnosis present

## 2024-04-24 DIAGNOSIS — I340 Nonrheumatic mitral (valve) insufficiency: Secondary | ICD-10-CM | POA: Diagnosis not present

## 2024-04-24 DIAGNOSIS — E538 Deficiency of other specified B group vitamins: Secondary | ICD-10-CM | POA: Diagnosis present

## 2024-04-24 DIAGNOSIS — E890 Postprocedural hypothyroidism: Secondary | ICD-10-CM | POA: Diagnosis present

## 2024-04-24 DIAGNOSIS — R931 Abnormal findings on diagnostic imaging of heart and coronary circulation: Secondary | ICD-10-CM | POA: Diagnosis present

## 2024-04-24 LAB — RETICULOCYTES
Immature Retic Fract: 25.3 % — ABNORMAL HIGH (ref 2.3–15.9)
RBC.: 3.06 MIL/uL — ABNORMAL LOW (ref 4.22–5.81)
Retic Count, Absolute: 60 10*3/uL (ref 19.0–186.0)
Retic Ct Pct: 2 % (ref 0.4–3.1)

## 2024-04-24 LAB — CBC WITH DIFFERENTIAL/PLATELET
Abs Immature Granulocytes: 0.03 10*3/uL (ref 0.00–0.07)
Basophils Absolute: 0.1 10*3/uL (ref 0.0–0.2)
Basophils Absolute: 0.1 10*3/uL (ref 0.0–0.1)
Basophils Relative: 1 %
Basos: 1 %
EOS (ABSOLUTE): 0.1 10*3/uL (ref 0.0–0.4)
Eos: 2 %
Eosinophils Absolute: 0.1 10*3/uL (ref 0.0–0.5)
Eosinophils Relative: 1 %
HCT: 23.8 % — ABNORMAL LOW (ref 39.0–52.0)
Hematocrit: 21.6 % — ABNORMAL LOW (ref 37.5–51.0)
Hemoglobin: 6.2 g/dL — CL (ref 13.0–17.7)
Hemoglobin: 6.7 g/dL — CL (ref 13.0–17.0)
Immature Grans (Abs): 0 10*3/uL (ref 0.0–0.1)
Immature Granulocytes: 0 %
Immature Granulocytes: 0 %
Lymphocytes Absolute: 1.3 10*3/uL (ref 0.7–3.1)
Lymphocytes Relative: 13 %
Lymphs Abs: 1 10*3/uL (ref 0.7–4.0)
Lymphs: 21 %
MCH: 20.7 pg — ABNORMAL LOW (ref 26.6–33.0)
MCH: 20.4 pg — ABNORMAL LOW (ref 26.0–34.0)
MCHC: 28.7 g/dL — ABNORMAL LOW (ref 31.5–35.7)
MCHC: 28.2 g/dL — ABNORMAL LOW (ref 30.0–36.0)
MCV: 72 fL — ABNORMAL LOW (ref 79–97)
MCV: 72.3 fL — ABNORMAL LOW (ref 80.0–100.0)
Monocytes Absolute: 0.9 10*3/uL (ref 0.1–0.9)
Monocytes Absolute: 0.8 10*3/uL (ref 0.1–1.0)
Monocytes Relative: 10 %
Monocytes: 15 %
Neutro Abs: 6 10*3/uL (ref 1.7–7.7)
Neutrophils Absolute: 3.8 10*3/uL (ref 1.4–7.0)
Neutrophils Relative %: 75 %
Neutrophils: 61 %
Platelets: 260 10*3/uL (ref 150–450)
Platelets: 271 10*3/uL (ref 150–400)
RBC: 2.99 x10E6/uL — ABNORMAL LOW (ref 4.14–5.80)
RBC: 3.29 MIL/uL — ABNORMAL LOW (ref 4.22–5.81)
RDW: 16.9 % — ABNORMAL HIGH (ref 11.6–15.4)
RDW: 19.1 % — ABNORMAL HIGH (ref 11.5–15.5)
WBC: 6.2 10*3/uL (ref 3.4–10.8)
WBC: 7.9 10*3/uL (ref 4.0–10.5)
nRBC: 0 % (ref 0.0–0.2)

## 2024-04-24 LAB — URINALYSIS, ROUTINE W REFLEX MICROSCOPIC
Bilirubin Urine: NEGATIVE
Glucose, UA: NEGATIVE mg/dL
Hgb urine dipstick: NEGATIVE
Ketones, ur: NEGATIVE mg/dL
Leukocytes,Ua: NEGATIVE
Nitrite: NEGATIVE
Protein, ur: NEGATIVE mg/dL
Specific Gravity, Urine: 1.008 (ref 1.005–1.030)
pH: 6 (ref 5.0–8.0)

## 2024-04-24 LAB — COMPREHENSIVE METABOLIC PANEL WITH GFR
ALT: 15 U/L (ref 0–44)
AST: 16 U/L (ref 15–41)
Albumin: 4.1 g/dL (ref 3.5–5.0)
Alkaline Phosphatase: 96 U/L (ref 38–126)
Anion gap: 10 (ref 5–15)
BUN: 21 mg/dL (ref 8–23)
CO2: 22 mmol/L (ref 22–32)
Calcium: 9.5 mg/dL (ref 8.9–10.3)
Chloride: 100 mmol/L (ref 98–111)
Creatinine, Ser: 0.91 mg/dL (ref 0.61–1.24)
GFR, Estimated: >60 mL/min (ref >60)
Glucose, Bld: 107 mg/dL — ABNORMAL HIGH (ref 70–99)
Potassium: 4.2 mmol/L (ref 3.5–5.1)
Sodium: 132 mmol/L — ABNORMAL LOW (ref 135–145)
Total Bilirubin: 0.4 mg/dL (ref 0.0–1.2)
Total Protein: 7.6 g/dL (ref 6.5–8.1)

## 2024-04-24 LAB — FOLATE: Folate: 9.2 ng/mL (ref >5.9)

## 2024-04-24 LAB — BASIC METABOLIC PANEL WITH GFR
BUN/Creatinine Ratio: 16 (ref 10–24)
BUN: 16 mg/dL (ref 8–27)
CO2: 21 mmol/L (ref 20–29)
Calcium: 8.6 mg/dL (ref 8.6–10.2)
Chloride: 96 mmol/L (ref 96–106)
Creatinine, Ser: 0.98 mg/dL (ref 0.76–1.27)
Glucose: 88 mg/dL (ref 70–99)
Potassium: 4.4 mmol/L (ref 3.5–5.2)
Sodium: 131 mmol/L — ABNORMAL LOW (ref 134–144)
eGFR: 83 mL/min/{1.73_m2} (ref >59)

## 2024-04-24 LAB — IRON AND TIBC
Iron: 36 ug/dL — ABNORMAL LOW (ref 45–182)
Saturation Ratios: 8 % — ABNORMAL LOW (ref 17.9–39.5)
TIBC: 469 ug/dL — ABNORMAL HIGH (ref 250–450)
UIBC: 433 ug/dL

## 2024-04-24 LAB — VITAMIN B12: Vitamin B-12: 121 pg/mL — ABNORMAL LOW (ref 180–914)

## 2024-04-24 LAB — POC OCCULT BLOOD, ED: Fecal Occult Bld: NEGATIVE

## 2024-04-24 LAB — PROTIME-INR
INR: 1.2 (ref 0.8–1.2)
Prothrombin Time: 14.9 s (ref 11.4–15.2)

## 2024-04-24 LAB — ABO/RH: ABO/RH(D): O POS

## 2024-04-24 LAB — PREPARE RBC (CROSSMATCH)

## 2024-04-24 LAB — FERRITIN: Ferritin: 3 ng/mL — ABNORMAL LOW (ref 24–336)

## 2024-04-24 MED ORDER — CYANOCOBALAMIN 1000 MCG/ML IJ SOLN
1000.0000 ug | Freq: Once | INTRAMUSCULAR | Status: AC
  Administered 2024-04-24: 1000 ug via INTRAMUSCULAR
  Filled 2024-04-24: qty 1

## 2024-04-24 MED ORDER — TAMSULOSIN HCL 0.4 MG PO CAPS
0.4000 mg | ORAL_CAPSULE | Freq: Two times a day (BID) | ORAL | Status: DC
  Administered 2024-04-24 – 2024-04-25 (×2): 0.4 mg via ORAL
  Filled 2024-04-24 (×2): qty 1

## 2024-04-24 MED ORDER — ATORVASTATIN CALCIUM 40 MG PO TABS
40.0000 mg | ORAL_TABLET | Freq: Every day | ORAL | Status: DC
Start: 2024-04-25 — End: 2024-04-25
  Administered 2024-04-25: 40 mg via ORAL
  Filled 2024-04-24: qty 1

## 2024-04-24 MED ORDER — LEVOTHYROXINE SODIUM 100 MCG PO TABS
100.0000 ug | ORAL_TABLET | Freq: Every day | ORAL | Status: DC
  Administered 2024-04-25: 100 ug via ORAL
  Filled 2024-04-24: qty 1

## 2024-04-24 MED ORDER — ACETAMINOPHEN 650 MG RE SUPP: 650.0000 mg | Freq: Four times a day (QID) | RECTAL | Status: DC | PRN

## 2024-04-24 MED ORDER — VITAMIN B-12 1000 MCG PO TABS
1000.0000 ug | ORAL_TABLET | Freq: Every day | ORAL | Status: DC
  Administered 2024-04-25: 1000 ug via ORAL
  Filled 2024-04-24: qty 1

## 2024-04-24 MED ORDER — ONDANSETRON HCL 4 MG PO TABS: 4.0000 mg | ORAL_TABLET | Freq: Four times a day (QID) | ORAL | Status: DC | PRN

## 2024-04-24 MED ORDER — ACETAMINOPHEN 325 MG PO TABS: 650.0000 mg | ORAL_TABLET | Freq: Four times a day (QID) | ORAL | Status: DC | PRN

## 2024-04-24 MED ORDER — ONDANSETRON HCL 4 MG/2ML IJ SOLN: 4.0000 mg | Freq: Four times a day (QID) | INTRAMUSCULAR | Status: DC | PRN

## 2024-04-24 MED ORDER — SODIUM CHLORIDE 0.9% IV SOLUTION: Freq: Once | INTRAVENOUS | Status: AC

## 2024-04-24 NOTE — Telephone Encounter (Signed)
 04/23/24 Hgb 6.2- per Dr Thamas Fillers should go to ED now for repeat/further evaluation, cancel TEE/R/LHC scheduled 04/26/24. I was unable to reach patient at all numbers listed for him. I was able to speak with patient's wife (DPR) and advised her Dr Katheryne Pane recommended pt go to ED now for repeat/further evaluation, cancel TEE and R/LHC scheduled 04/26/24 until this is sorted out. Patient's wife tells me pt has not mentioned any bleeding issues.  Patient's wife tells me she will take care of this and will be back in touch with us .

## 2024-04-24 NOTE — ED Triage Notes (Addendum)
 Patient had labs drawn yesterday. Was called today stating his hemoglobin was 6.2. Denies bleeding anywhere. Denies dizziness or fatigue. Feels normal.

## 2024-04-24 NOTE — Telephone Encounter (Signed)
 Pt called in to report that he is checking in to [facility] now

## 2024-04-24 NOTE — H&P (Signed)
 History and Physical    Patient: Terry [name]:[id number] DOB: 11-10-1951 DOA: 04/24/2024 DOS: the patient was seen and examined on 04/24/2024 PCP: Sinda Duel, PA  Patient coming from: Home  Chief Complaint:  Chief Complaint  Patient presents with   Abnormal Lab   HPI: Terry Miranda is a 73 y.o. male with medical history significant of hypothyroidism, moderate mitral regurgitation, tobacco abuse, coronary calcium , hyperlipidemia, abdominal aortic atherosclerosis who was sent from his cardiology office due to anemia with a hemoglobin 6.4 gr/dL.  Denied fatigue.  Denied hypersomnolence.  The patient stated that he has been taking some ibuprofen tablets after he ran out of acetaminophen  recently, but he has not been using more than 1 at a time.  He stated that he has to use maybe 4 or 5 tablets.  No abdominal pain, nausea, emesis, diarrhea, constipation, melena or hematochezia.  No flank pain, dysuria, frequency or hematuria. He denied fever, chills, rhinorrhea, sore throat, wheezing or hemoptysis.  No chest pain, palpitations, diaphoresis, PND, orthopnea or pitting edema of the lower extremities.  No polyuria, polydipsia, polyphagia or blurred vision.   Lab work: Urinalysis was straw in color, but was otherwise normal.  CBC showed a white count of 7.9, hemoglobin 6.7 g/dL platelets 213.  PT and INR were normal.  Iron was 36 and TIBC 569 ug/dL.  Ferritin was only 3 ng/mL.  Vitamin B12 121 pg/mL.  Folate level is normal.  CMP was normal with the exception of a sodium level of 132 mmol/L and a glucose of 107 mg/dL.   ED course: Initial vital signs were temperature 98 F, pulse 84, respiration 19, BP 141/80 mmHg O2 sat 100% on room air.  The patient was ordered 2 unit PRBC transfusion.  I added vitamin B12 1000 mcg IVP.  Review of Systems: As mentioned in the history of present illness. All other systems reviewed and are negative.  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 He has never used smokeless tobacco. He reports current alcohol use. He reports that he does not use drugs.  Allergies  Allergen Reactions   Amoxicillin Swelling    face swelled   Fish Oil Swelling    Face swelling   Saw Palmetto Swelling    Face swelling    Family History  Problem Relation Age of Onset   Thyroid  cancer Brother    Colon cancer Neg Hx    Diabetes Neg Hx     Prior to Admission medications   Medication Sig Start Date End Date Taking? Authorizing Provider  acetaminophen  (TYLENOL ) 500 MG tablet Take 1,000 mg by mouth every 6 (six) hours as needed for moderate pain (pain score 4-6).    [provider]  atorvastatin  (LIPITOR) 40 MG tablet Take 1 tablet (40 mg total) by mouth daily. 11/19/21 04/22/24  Avanell Leigh, MD  levothyroxine  (SYNTHROID ) 100 MCG tablet Take 1 tablet (100 mcg total) by mouth daily. 03/11/24   Thapa, [place], MD  tamsulosin (  FLOMAX) 0.4 MG CAPS capsule TAKE 1 CAPSULE BY MOUTH EVERY DAY Patient taking differently: Take 0.4 mg by mouth 2 (two) times daily. 12/19/19   McKenzie, Arden Beck, MD  VITAMIN D PO Take 1 capsule by mouth daily.    [provider]    Physical Exam: Vitals:   04/24/24 1030 04/24/24 1039 04/24/24 1100 04/24/24 1115  BP: 127/72  138/65 136/77  Pulse: (!) 51 63 (!) 45 77  Resp:      Temp:      TempSrc:      SpO2: 90% 100% 100% 97%  Weight:      Height:       Physical Exam Vitals and nursing note reviewed.  Constitutional:      General: He is awake. He is  not in acute distress.    Appearance: Normal appearance. He is ill-appearing.  HENT:     Head: Normocephalic.     Nose: No rhinorrhea.     Mouth/Throat:     Mouth: Mucous membranes are moist.  Eyes:     General: No scleral icterus.    Pupils: Pupils are equal, round, and reactive to light.  Neck:     Vascular: No JVD.  Cardiovascular:     Rate and Rhythm: Normal rate and regular rhythm.     Heart sounds: S1 normal and S2 normal.  Pulmonary:     Effort: Pulmonary effort is normal.     Breath sounds: Normal breath sounds. No wheezing, rhonchi or rales.  Abdominal:     General: Bowel sounds are normal. There is no distension.     Palpations: Abdomen is soft.     Tenderness: There is no abdominal tenderness. There is no right CVA tenderness, left CVA tenderness or guarding.  Musculoskeletal:     Cervical back: Neck supple.     Right lower leg: No edema.     Left lower leg: No edema.  Skin:    General: Skin is warm and dry.     Coloration: Skin is pale.  Neurological:     General: No focal deficit present.     Mental Status: He is alert and oriented to person, place, and time.  Psychiatric:        Mood and Affect: Mood normal.        Behavior: Behavior normal. Behavior is cooperative.     Data Reviewed:  Results are pending, will review when available. 06/20/2023 echocardiogram report: IMPRESSIONS:   1. Severe prolapse of posterior MV leaflet with probable severe MR; suggest TEE to further assess.  2. Left ventricular ejection fraction, by estimation, is 55 to 60%. The left ventricle has normal function. The left ventricle has no regional wall motion abnormalities. The left ventricular internal cavity size was mildly dilated. Left ventricular diastolic parameters are indeterminate. Elevated left atrial pressure. The average left ventricular global longitudinal strain is -25.9 %. The global longitudinal strain is normal.  3. Right ventricular systolic function is  normal. The right ventricular size is normal.  4. Left atrial size was severely dilated.  5. The mitral valve is normal in structure. Severe mitral valve regurgitation. No evidence of mitral stenosis. There is severe holosystolic prolapse of posterior leaflet of the mitral valve.  6. The aortic valve is tricuspid. Aortic valve regurgitation is trivial. Aortic valve sclerosis/calcification is present, without any evidence of aortic stenosis.  7. The inferior vena cava is normal in size with greater than 50% respiratory variability, suggesting right atrial pressure of 3  mmHg.    Assessment and Plan: Principal Problem:   Symptomatic anemia In the setting of:   B12 deficiency   Iron deficiency anemia Telemetry/observation. [facility] will evaluate in the morning. Will preemptively keep NPO after midnight. Monitor hematocrit and hemoglobin. Transfuse further as needed.  Active Problems:   Hypothyroidism, postradioiodine therapy Continue levothyroxine  1000 mcg p.o. daily.    Moderate mitral regurgitation   Elevated coronary artery calcium  score Cardiac cath has been canceled. Will be donated future date.    Tobacco abuse Tobacco cessation would be beneficial. Nicotine replacement therapy may be ordered as needed.    Abdominal aortic atherosclerosis (HCC)   Hyperlipidemia Continue atorvastatin  40 mg p.o. daily.    Advance Care Planning:   Code Status: Full Code   Consults: [facility] will see on 04/26/2019 5 AM.  Family Communication: His wife was at bedside.  Severity of Illness: The appropriate patient status for this patient is OBSERVATION. Observation status is judged to be reasonable and necessary in order to provide the required intensity of service to ensure the patient's safety. The patient's presenting symptoms, physical exam findings, and initial radiographic and laboratory data in the context of their medical condition is felt to place them at decreased risk for  further clinical deterioration. Furthermore, it is anticipated that the patient will be medically stable for discharge from the hospital within 2 midnights of admission.   Author: Danice Dural, MD 04/24/2024 12:19 PM  For on call review [email or web address].   This document was prepared using Dragon voice recognition software and may contain some unintended transcription errors.

## 2024-04-24 NOTE — ED Notes (Signed)
 ED TO INPATIENT HANDOFF REPORT  ED Nurse Name and Phone #:   S Name/Age/Gender Terry Miranda 73 y.o. male Room/Bed: WA09/WA09  Code Status   Code Status: Not on file  Home/SNF/Other Home Patient oriented to: self, place, time, and situation Is this baseline? Yes   Triage Complete: Triage complete  Chief Complaint Symptomatic anemia [D64.9]  Triage Note Patient had labs drawn yesterday. Was called today stating his hemoglobin was 6.2. Denies bleeding anywhere. Denies dizziness or fatigue. Feels normal.   Allergies Allergies  Allergen Reactions   Amoxicillin Swelling    face swelled   Fish Oil Swelling    Face swelling   Saw Palmetto Swelling    Face swelling    Level of Care/Admitting Diagnosis ED Disposition     ED Disposition  Admit   Condition  --   Comment  Hospital Area: [facility] [[id number]]  Level of Care: Telemetry [5]  Admit to tele based on following criteria: Monitor for Ischemic changes  May place patient in observation at [facility] or [facility] if equivalent level of care is available:: No  Covid Evaluation: Asymptomatic - no recent exposure (last 10 days) testing not required  Diagnosis: Symptomatic anemia [7829562]  Admitting Physician: Danice Dural [[id number]]  Attending Physician: Danice Dural [[id number]]          B Medical/Surgery History Past Medical History:  Diagnosis Date   Arthritis    Atypical nevus 02/11/2008   Left Lower Deltoid-Slight   Atypical nevus 08/26/2003   Upper Mid Right Back Sup-Slight to Moderate (w/s), Upper Mid Right Back Inf(Slight), Mid Back-Slight to Moderate(w/s), Mid Right Back Sup-Slight to Moderate(w/s), and Mid Right Back Inf-Slight to Moderate(w/s)   Thyroid  disease    Past Surgical History:  Procedure Laterality Date   COLONOSCOPY     HERNIA REPAIR       A IV Location/Drains/Wounds Patient Lines/Drains/Airways Status     Active Line/Drains/Airways     Name  Placement date Placement time Site Days   Peripheral IV 04/24/24 20 G 1" Left Forearm 04/24/24  1044  Forearm  less than 1            Intake/Output Last 24 hours No intake or output data in the 24 hours ending 04/24/24 1300  Labs/Imaging Results for orders placed or performed during the hospital encounter of 04/24/24 (from the past 48 hours)  Type and screen [facility]     Status: None (Preliminary result)   Collection Time: 04/24/24 10:41 AM  Result Value Ref Range   ABO/RH(D) PENDING    Antibody Screen PENDING    Sample Expiration      04/27/2024,2359 Performed at [facility], [address] [place], [place] [address]   CBC with Differential     Status: Abnormal   Collection Time: 04/24/24 10:42 AM  Result Value Ref Range   WBC 7.9 4.0 - 10.5 K/uL   RBC 3.29 (L) 4.22 - 5.81 MIL/uL   Hemoglobin 6.7 (LL) 13.0 - 17.0 g/dL    Comment: REPEATED TO VERIFY THIS CRITICAL RESULT HAS VERIFIED AND BEEN CALLED TO Deiona Hooper, H RN BY JENNIFER ELGIN ON 05 14 2025 AT 1124, AND HAS BEEN READ BACK. CRITICAL RESULT VERIFIED    HCT 23.8 (L) 39.0 - 52.0 %   MCV 72.3 (L) 80.0 - 100.0 fL   MCH 20.4 (L) 26.0 - 34.0 pg   MCHC 28.2 (L) 30.0 - 36.0 g/dL   RDW  19.1 (H) 11.5 - 15.5 %   Platelets 271 150 - 400 K/uL   nRBC 0.0 0.0 - 0.2 %   Neutrophils Relative % 75 %   Neutro Abs 6.0 1.7 - 7.7 K/uL   Lymphocytes Relative 13 %   Lymphs Abs 1.0 0.7 - 4.0 K/uL   Monocytes Relative 10 %   Monocytes Absolute 0.8 0.1 - 1.0 K/uL   Eosinophils Relative 1 %   Eosinophils Absolute 0.1 0.0 - 0.5 K/uL   Basophils Relative 1 %   Basophils Absolute 0.1 0.0 - 0.1 K/uL   Immature Granulocytes 0 %   Abs Immature Granulocytes 0.03 0.00 - 0.07 K/uL    Comment: Performed at [facility], [address], [place], [place] [address]  Comprehensive metabolic panel     Status: Abnormal   Collection Time: 04/24/24 10:42 AM  Result Value Ref Range   Sodium  132 (L) 135 - 145 mmol/L   Potassium 4.2 3.5 - 5.1 mmol/L   Chloride 100 98 - 111 mmol/L   CO2 22 22 - 32 mmol/L   Glucose, Bld 107 (H) 70 - 99 mg/dL    Comment: Glucose reference range applies only to samples taken after fasting for at least 8 hours.   BUN 21 8 - 23 mg/dL   Creatinine, Ser 8.29 0.61 - 1.24 mg/dL   Calcium  9.5 8.9 - 10.3 mg/dL   Total Protein 7.6 6.5 - 8.1 g/dL   Albumin 4.1 3.5 - 5.0 g/dL   AST 16 15 - 41 U/L   ALT 15 0 - 44 U/L   Alkaline Phosphatase 96 38 - 126 U/L   Total Bilirubin 0.4 0.0 - 1.2 mg/dL   GFR, Estimated >56 >21 mL/min    Comment: (NOTE) Calculated using the CKD-EPI Creatinine Equation (2021)    Anion gap 10 5 - 15    Comment: Performed at [facility], [address], [place], [place] [address]  Protime-INR     Status: None   Collection Time: 04/24/24 10:42 AM  Result Value Ref Range   Prothrombin Time 14.9 11.4 - 15.2 seconds   INR 1.2 0.8 - 1.2    Comment: (NOTE) INR goal varies based on device and disease states. Performed at [facility], [address], [place], [place] [address]   ABO/Rh     Status: None (Preliminary result)   Collection Time: 04/24/24 10:42 AM  Result Value Ref Range   ABO/RH(D) PENDING   Prepare RBC (crossmatch)     Status: None   Collection Time: 04/24/24 11:44 AM  Result Value Ref Range   Order Confirmation      ORDER PROCESSED BY BLOOD BANK Performed at [facility], [address] [place], [place] [address]   Reticulocytes     Status: Abnormal   Collection Time: 04/24/24 12:00 PM  Result Value Ref Range   Retic Ct Pct 2.0 0.4 - 3.1 %   RBC. 3.06 (L) 4.22 - 5.81 MIL/uL   Retic Count, Absolute 60.0 19.0 - 186.0 K/uL   Immature Retic Fract 25.3 (H) 2.3 - 15.9 %    Comment: Performed at [facility], [address], [place], [place] [address]  POC occult blood, ED     Status: None   Collection Time: 04/24/24 12:27 PM  Result  Value Ref Range   Fecal Occult Bld NEGATIVE NEGATIVE   No results found.  Pending Labs [employer] (From admission, onward)  Start     Ordered   04/24/24 1132  Vitamin B12  (Anemia Panel (PNL))  Once,   URGENT        04/24/24 1131   04/24/24 1132  Folate  (Anemia Panel (PNL))  Once,   URGENT        04/24/24 1131   04/24/24 1132  Iron and TIBC  (Anemia Panel (PNL))  Once,   URGENT        04/24/24 1131   04/24/24 1132  Ferritin  (Anemia Panel (PNL))  Once,   URGENT        04/24/24 1131   04/24/24 1008  Urinalysis, Routine w reflex microscopic -Urine, Clean Catch  Once,   URGENT       Question:  Specimen Source  Answer:  Urine, Clean Catch   04/24/24 1007            Vitals/Pain Today's Vitals   04/24/24 1030 04/24/24 1039 04/24/24 1100 04/24/24 1115  BP: 127/72  138/65 136/77  Pulse: (!) 51 63 (!) 45 77  Resp:      Temp:      TempSrc:      SpO2: 90% 100% 100% 97%  Weight:      Height:      PainSc:        Isolation Precautions No active isolations  Medications Medications  0.9 %  sodium chloride  infusion (Manually program via Guardrails IV Fluids) (has no administration in time range)    Mobility walks     Focused Assessments    R Recommendations: See Admitting Provider Note  Report given to:   Additional Notes:

## 2024-04-24 NOTE — ED Provider Notes (Signed)
 [place] EMERGENCY DEPARTMENT AT [facility] Provider Note  CSN: [id number] Arrival date & time: 04/24/24 6213  Chief Complaint(s) Abnormal Lab  HPI Terry Miranda is a 73 y.o. male with past medical history as below, significant for mitral regurgitation Dr. Dean Every, HLD, tobacco use, arthritis who presents to the ED with complaint of abnormal labs/hemoglobin  Patient had preoperative labs ordered yesterday for TEE/LHC Dr Katheryne Pane, was called to come to the ER because his hemoglobin was low.  Patient reports he is feeling okay, he has no dyspnea at rest, lightheadedness, near syncope.  Denies any bleeding that he is appreciated.  No increased bruising.  No blood thinners.  No recent diet changes.  No illicit drug use or daily alcohol use.  Patient does report that he does have some exertional dyspnea that has been attributed to his mitral regurgitation for which he is scheduled to have echo and LHC done later this week.   Past Medical History Past Medical History:  Diagnosis Date   Arthritis    Atypical nevus 02/11/2008   Left Lower Deltoid-Slight   Atypical nevus 08/26/2003   Upper Mid Right Back Sup-Slight to Moderate (w/s), Upper Mid Right Back Inf(Slight), Mid Back-Slight to Moderate(w/s), Mid Right Back Sup-Slight to Moderate(w/s), and Mid Right Back Inf-Slight to Moderate(w/s)   Thyroid  disease    Patient Active Problem List   Diagnosis Date Noted   Symptomatic anemia 04/24/2024   Tobacco abuse 04/17/2024   Abdominal aortic atherosclerosis (HCC) 10/16/2023   Moderate mitral regurgitation 02/24/2023   Hyperlipidemia 02/24/2023   Elevated coronary artery calcium  score 02/24/2023   Slow heart rate 04/27/2021   Cardiac murmur 04/27/2021   Hypothyroidism, postradioiodine therapy 02/27/2015   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 Authorizing Provider  acetaminophen   (TYLENOL ) 500 MG tablet Take 1,000 mg by mouth every 6 (six) hours as needed for moderate pain (pain score 4-6).    [provider]  atorvastatin  (LIPITOR) 40 MG tablet Take 1 tablet (40 mg total) by mouth daily. 11/19/21 04/22/24  Avanell Leigh, MD  levothyroxine  (SYNTHROID ) 100 MCG tablet Take 1 tablet (100 mcg total) by mouth daily. 03/11/24   Thapa, [place], MD  tamsulosin (FLOMAX) 0.4 MG CAPS capsule TAKE 1 CAPSULE BY MOUTH EVERY DAY Patient taking differently: Take 0.4 mg by mouth 2 (two) times daily. 12/19/19   McKenzie, Arden Beck, MD  VITAMIN D PO Take 1 capsule by mouth daily.    [provider]                                                                                                                                    Past Surgical History Past Surgical History:  Procedure Laterality Date   COLONOSCOPY     HERNIA REPAIR     Family History Family History  Problem Relation  Age of Onset   Thyroid  cancer Brother    Colon cancer Neg Hx    Diabetes Neg Hx     Social History Social History   Tobacco Use   Smoking status: Every Day    Current packs/day: 1.00    Types: Cigarettes   Smokeless tobacco: Never  Substance Use Topics   Alcohol use: Yes    Comment: rare   Drug use: No   Allergies Amoxicillin, Fish oil, and Saw palmetto  Review of Systems A thorough review of systems was obtained and all systems are negative except as noted in the HPI and PMH.   Physical Exam Vital Signs  I have reviewed the triage vital signs BP 136/77   Pulse 77   Temp 98 F (36.7 C) (Oral)   Resp 19   Ht 5\' 10"  (1.778 m)   Wt 86.2 kg   SpO2 97%   BMI 27.26 kg/m  Physical Exam Vitals and nursing note reviewed.  Constitutional:      General: He is not in acute distress.    Appearance: He is well-developed.  HENT:     Head: Normocephalic and atraumatic.     Right Ear: External ear normal.     Left Ear: External ear normal.     Mouth/Throat:     Mouth: Mucous  membranes are moist.  Eyes:     General: No scleral icterus. Cardiovascular:     Rate and Rhythm: Normal rate and regular rhythm.     Pulses: Normal pulses.     Heart sounds: Murmur heard.  Pulmonary:     Effort: Pulmonary effort is normal. No respiratory distress.     Breath sounds: Normal breath sounds.  Abdominal:     General: Abdomen is flat.     Palpations: Abdomen is soft.     Tenderness: There is no abdominal tenderness.  Genitourinary:    Comments: External hemorrhoids noted on exam, not thrombosed, no frank bleeding.  Brown stool in rectal vault Musculoskeletal:     Cervical back: No rigidity.     Right lower leg: No edema.     Left lower leg: No edema.  Skin:    General: Skin is warm and dry.     Capillary Refill: Capillary refill takes less than 2 seconds.  Neurological:     Mental Status: He is alert.  Psychiatric:        Mood and Affect: Mood normal.        Behavior: Behavior normal.     ED Results and Treatments Labs (all labs ordered are listed, but only abnormal results are displayed) Labs Reviewed  CBC WITH DIFFERENTIAL/PLATELET - Abnormal; Notable for the following components:      Result Value   RBC 3.29 (*)    Hemoglobin 6.7 (*)    HCT 23.8 (*)    MCV 72.3 (*)    MCH 20.4 (*)    MCHC 28.2 (*)    RDW 19.1 (*)    All other components within normal limits  COMPREHENSIVE METABOLIC PANEL WITH GFR - Abnormal; Notable for the following components:   Sodium 132 (*)    Glucose, Bld 107 (*)    All other components within normal limits  PROTIME-INR  URINALYSIS, ROUTINE W REFLEX MICROSCOPIC  VITAMIN B12  FOLATE  IRON AND TIBC  FERRITIN  RETICULOCYTES  POC OCCULT BLOOD, ED  TYPE AND SCREEN  PREPARE RBC (CROSSMATCH)  ABO/RH  Radiology No results found.  Pertinent labs & imaging results that were available during my care of the  patient were reviewed by me and considered in my medical decision making (see MDM for details).  Medications Ordered in ED Medications  0.9 %  sodium chloride  infusion (Manually program via Guardrails IV Fluids) (has no administration in time range)                                                                                                                                     Procedures .Critical Care  Performed by: Teddi Favors, DO Authorized by: Teddi Favors, DO   Critical care provider statement:    Critical care time (minutes):  31   Critical care time was exclusive of:  Separately billable procedures and treating other patients   Critical care was necessary to treat or prevent imminent or life-threatening deterioration of the following conditions:  Circulatory failure   Critical care was time spent personally by me on the following activities:  Development of treatment plan with patient or surrogate, discussions with consultants, evaluation of patient's response to treatment, examination of patient, ordering and review of laboratory studies, ordering and review of radiographic studies, ordering and performing treatments and interventions, pulse oximetry, re-evaluation of patient's condition, review of old charts and obtaining history from patient or surrogate   Care discussed with: admitting provider     (including critical care time)  Medical Decision Making / ED Course    Medical Decision Making:    Terry Miranda is a 73 y.o. male with past medical history as below, significant for mitral regurgitation Dr. Katheryne Pane, HLD, tobacco use, arthritis who presents to the ED with complaint of abnormal labs/hemoglobin. The complaint involves an extensive differential diagnosis and also carries with it a high risk of complications and morbidity.  Serious etiology was considered. Ddx includes but is not limited to: IDA, GI bleed, bone marrow suppression, b12 deficiency, etc  Complete  initial physical exam performed, notably the patient was in no distress, hds.    Reviewed and confirmed nursing documentation for past medical history, family history, social history.  Vital signs reviewed.     Brief summary:  73 year old male history as above including mitral regurg follows with Dr. Katheryne Pane here with normal outpatient labs hgb 6.2 on CBC yesterday hgb today is 6.7.  Microcytic.  Platelets are stable. Hemoccult was negative Send anemia panel, he is consented for PRBC transfusion, 2 units. Will admit for symptomatic anemia (exertional dyspnea)  Clinical Course as of 04/24/24 1230  Wed Apr 24, 2024  1144 FOBT negative.  [SG]    Clinical Course User Index [SG] Teddi Favors, DO     Admit to hospitalist HDS             Additional history obtained: -Additional history obtained from spouse -External records from outside source obtained and reviewed including: Chart review including  previous notes, labs, imaging, consultation notes including  Outpatient labs, prior cardiology documentation   Lab Tests: -I ordered, reviewed, and interpreted labs.   The pertinent results include:   Labs Reviewed  CBC WITH DIFFERENTIAL/PLATELET - Abnormal; Notable for the following components:      Result Value   RBC 3.29 (*)    Hemoglobin 6.7 (*)    HCT 23.8 (*)    MCV 72.3 (*)    MCH 20.4 (*)    MCHC 28.2 (*)    RDW 19.1 (*)    All other components within normal limits  COMPREHENSIVE METABOLIC PANEL WITH GFR - Abnormal; Notable for the following components:   Sodium 132 (*)    Glucose, Bld 107 (*)    All other components within normal limits  PROTIME-INR  URINALYSIS, ROUTINE W REFLEX MICROSCOPIC  VITAMIN B12  FOLATE  IRON AND TIBC  FERRITIN  RETICULOCYTES  POC OCCULT BLOOD, ED  TYPE AND SCREEN  PREPARE RBC (CROSSMATCH)  ABO/RH    Notable for hemoglobin is low, microcytic  EKG   EKG Interpretation Date/Time:    Ventricular Rate:    PR Interval:     QRS Duration:    QT Interval:    QTC Calculation:   R Axis:      Text Interpretation:           Imaging Studies ordered: na   Medicines ordered and prescription drug management: Meds ordered this encounter  Medications   0.9 %  sodium chloride  infusion (Manually program via Guardrails IV Fluids)    -I have reviewed the patients home medicines and have made adjustments as needed   Consultations Obtained: I requested consultation with the [facility],  and discussed lab and imaging findings as well as pertinent plan    Cardiac Monitoring: Continuous pulse oximetry interpreted by myself, 98% on RA.    Social Determinants of Health:  Diagnosis or treatment significantly limited by social determinants of health: current smoker   Reevaluation: After the interventions noted above, I reevaluated the patient and found that they have stayed the same  Co morbidities that complicate the patient evaluation  Past Medical History:  Diagnosis Date   Arthritis    Atypical nevus 02/11/2008   Left Lower Deltoid-Slight   Atypical nevus 08/26/2003   Upper Mid Right Back Sup-Slight to Moderate (w/s), Upper Mid Right Back Inf(Slight), Mid Back-Slight to Moderate(w/s), Mid Right Back Sup-Slight to Moderate(w/s), and Mid Right Back Inf-Slight to Moderate(w/s)   Thyroid  disease       Dispostion: Disposition decision including need for hospitalization was considered, and patient admitted to the hospital.    Final Clinical Impression(s) / ED Diagnoses Final diagnoses:  Microcytic anemia  Symptomatic anemia        Teddi Favors, DO 04/24/24 1230

## 2024-04-25 DIAGNOSIS — D649 Anemia, unspecified: Secondary | ICD-10-CM | POA: Diagnosis not present

## 2024-04-25 LAB — COMPREHENSIVE METABOLIC PANEL WITH GFR
ALT: 14 U/L (ref 0–44)
AST: 14 U/L — ABNORMAL LOW (ref 15–41)
Albumin: 3.5 g/dL (ref 3.5–5.0)
Alkaline Phosphatase: 78 U/L (ref 38–126)
Anion gap: 7 (ref 5–15)
BUN: 15 mg/dL (ref 8–23)
CO2: 25 mmol/L (ref 22–32)
Calcium: 8.7 mg/dL — ABNORMAL LOW (ref 8.9–10.3)
Chloride: 99 mmol/L (ref 98–111)
Creatinine, Ser: 0.84 mg/dL (ref 0.61–1.24)
GFR, Estimated: >60 mL/min (ref >60)
Glucose, Bld: 99 mg/dL (ref 70–99)
Potassium: 3.9 mmol/L (ref 3.5–5.1)
Sodium: 131 mmol/L — ABNORMAL LOW (ref 135–145)
Total Bilirubin: 0.5 mg/dL (ref 0.0–1.2)
Total Protein: 6.3 g/dL — ABNORMAL LOW (ref 6.5–8.1)

## 2024-04-25 LAB — BPAM RBC
Blood Product Expiration Date: 202506102359
Blood Product Expiration Date: 202506112359
ISSUE DATE / TIME: 202505141805
ISSUE DATE / TIME: 202505142120
Unit Type and Rh: 5100
Unit Type and Rh: 5100

## 2024-04-25 LAB — TYPE AND SCREEN
ABO/RH(D): O POS
Antibody Screen: NEGATIVE
Unit division: 0
Unit division: 0

## 2024-04-25 LAB — CBC
HCT: 28.7 % — ABNORMAL LOW (ref 39.0–52.0)
Hemoglobin: 8.3 g/dL — ABNORMAL LOW (ref 13.0–17.0)
MCH: 22.1 pg — ABNORMAL LOW (ref 26.0–34.0)
MCHC: 28.9 g/dL — ABNORMAL LOW (ref 30.0–36.0)
MCV: 76.5 fL — ABNORMAL LOW (ref 80.0–100.0)
Platelets: 247 10*3/uL (ref 150–400)
RBC: 3.75 MIL/uL — ABNORMAL LOW (ref 4.22–5.81)
RDW: 19.8 % — ABNORMAL HIGH (ref 11.5–15.5)
WBC: 7 10*3/uL (ref 4.0–10.5)
nRBC: 0 % (ref 0.0–0.2)

## 2024-04-25 LAB — HIV ANTIBODY (ROUTINE TESTING W REFLEX): HIV Screen 4th Generation wRfx: NONREACTIVE

## 2024-04-25 MED ORDER — IRON SUCROSE 200 MG IVPB - SIMPLE MED
200.0000 mg | Freq: Once | Status: AC
  Administered 2024-04-25: 200 mg via INTRAVENOUS
  Filled 2024-04-25: qty 200

## 2024-04-25 MED ORDER — CYANOCOBALAMIN 1000 MCG PO TABS: 1000.0000 ug | ORAL_TABLET | Freq: Every day | ORAL | 0 refills | Status: AC

## 2024-04-25 MED ORDER — FERROUS GLUCONATE 324 (38 FE) MG PO TABS
324.0000 mg | ORAL_TABLET | Freq: Two times a day (BID) | ORAL | Status: DC
  Administered 2024-04-25: 324 mg via ORAL
  Filled 2024-04-25 (×2): qty 1

## 2024-04-25 MED ORDER — FERROUS GLUCONATE 324 (38 FE) MG PO TABS: 324.0000 mg | ORAL_TABLET | Freq: Two times a day (BID) | ORAL | 0 refills | Status: AC

## 2024-04-25 NOTE — Care Management Obs Status (Signed)
 MEDICARE OBSERVATION STATUS NOTIFICATION   Patient Details  Name: Terry Miranda MRN: [id number] Date of Birth: 09/02/1951   Medicare Observation Status Notification Given:  Yes    Tessie Fila, RN 04/25/2024, 12:41 PM

## 2024-04-25 NOTE — Progress Notes (Signed)
   04/25/24 1038  TOC Brief Assessment  Insurance and Status Reviewed  Patient has primary care physician Yes  Home environment has been reviewed Pt lives in single family home with spouse  Prior level of function: Independent  Prior/Current Home Services No current home services  Social Drivers of Health Review SDOH reviewed no interventions necessary  Readmission risk has been reviewed Yes (NA)  Transition of care needs no transition of care needs at this time

## 2024-04-25 NOTE — Plan of Care (Signed)

## 2024-04-25 NOTE — Discharge Summary (Signed)
 Physician Discharge Summary  Terry Miranda NUU:[id number] DOB: 03-02-1951 DOA: 04/24/2024  PCP: Sinda Duel, PA  Admit date: 04/24/2024 Discharge date: 04/25/2024  Admitted From: Home Disposition: Home  Recommendations for Outpatient Follow-up:  Follow up with PCP in 1-2 weeks Follow-up with cardiology to reschedule heart cath as discussed  Home Health: None Equipment/Devices: None  Discharge Condition: Stable CODE STATUS: Full Diet recommendation: Low-salt low-fat low-carb diet  Brief/Interim Summary: Terry Miranda is a 73 y.o. male with medical history significant of hypothyroidism, moderate mitral regurgitation, tobacco abuse, coronary calcium , hyperlipidemia, abdominal aortic atherosclerosis who was sent from his cardiology office due to anemia with a hemoglobin 6.4 gr/dL.    Patient admitted as above with acute profound anemia, interestingly patient had minimal symptoms per discussion with him and his wife at bedside.  Regardless status post transfusion patient's hemoglobin is now within normal limits.  Labs revealed a profound iron deficiency/B12 deficiency anemia, likely dietary related given discussion with wife and patient about patient's poor eating habits.  Recommended increased leafy greens and oral iron, in the interim IV iron here x 1 with twice daily iron tablets for 1 month as well as B12 supplementation, follow-up with PCP in the next 1 to 2 weeks to reevaluate to ensure tolerance of iron and to titrate as appropriate.  Follow-up with cardiology to reschedule heart cath per their schedule/recommendations.  Discharge Diagnoses:  Principal Problem:   Symptomatic anemia Active Problems:   Hypothyroidism, postradioiodine therapy   Moderate mitral regurgitation   Hyperlipidemia   Elevated coronary artery calcium  score   Tobacco abuse   Abdominal aortic atherosclerosis (HCC)   B12 deficiency   Iron deficiency anemia   Symptomatic anemia B12 deficiency Iron  deficiency anemia -No indication for GI, Hemoccult negative, while patient does take ibuprofen/NSAIDs he denies any hematemesis, coffee-ground emesis or black tarry stools -Iron deficiency and B12 deficiency noted, supplemented appropriately, follow-up outpatient for repeat testing   Hypothyroidism, postradioiodine therapy Continue home levothyroxine    Moderate mitral regurgitation Elevated coronary artery calcium  score Follow-up with cardiology to reevaluate for catheterization   Tobacco abuse Discussed cessation given risk factors above, recommend cancer screening as well with PCP   Abdominal aortic atherosclerosis (HCC) Hyperlipidemia Continue atorvastatin  40 mg p.o. daily.  Discharge Instructions  Discharge Instructions     Call MD for:  difficulty breathing, headache or visual disturbances   Complete by: As directed    Call MD for:  extreme fatigue   Complete by: As directed    Call MD for:  hives   Complete by: As directed    Call MD for:  persistant dizziness or light-headedness   Complete by: As directed    Call MD for:  persistant nausea and vomiting   Complete by: As directed    Call MD for:  severe uncontrolled pain   Complete by: As directed    Call MD for:  temperature >100.4   Complete by: As directed    Diet - low sodium heart healthy   Complete by: As directed    Increase activity slowly   Complete by: As directed       Allergies as of 04/25/2024       Reactions   Amoxicillin Swelling   face swelled   Fish Oil Swelling   Face swelling   Saw Palmetto Swelling   Face swelling        Medication List     TAKE these medications    acetaminophen  500 MG tablet  Commonly known as: TYLENOL  Take 1,000 mg by mouth every 6 (six) hours as needed for moderate pain (pain score 4-6).   atorvastatin  40 MG tablet Commonly known as: LIPITOR Take 1 tablet (40 mg total) by mouth daily.   cyanocobalamin  1000 MCG tablet Take 1 tablet (1,000 mcg total) by  mouth daily. Start taking on: Apr 26, 2024   ferrous gluconate 324 MG tablet Commonly known as: FERGON Take 1 tablet (324 mg total) by mouth 2 (two) times daily with a meal.   ibuprofen 200 MG tablet Commonly known as: ADVIL Take 400 mg by mouth every 6 (six) hours as needed for mild pain (pain score 1-3) or moderate pain (pain score 4-6).   levothyroxine  100 MCG tablet Commonly known as: SYNTHROID  Take 1 tablet (100 mcg total) by mouth daily.   tamsulosin 0.4 MG Caps capsule Commonly known as: FLOMAX TAKE 1 CAPSULE BY MOUTH EVERY DAY What changed: when to take this   VITAMIN D PO Take 1 capsule by mouth daily.        Allergies  Allergen Reactions   Amoxicillin Swelling    face swelled   Fish Oil Swelling    Face swelling   Saw Palmetto Swelling    Face swelling    Consultations: -None  Procedures/Studies: No results found.   Subjective: No acute issues or events overnight denies nausea vomiting diarrhea constipation any fevers chills or chest pain   Discharge Exam: Vitals:   04/25/24 0439 04/25/24 1209  BP: 139/67 128/63  Pulse: (!) 59 (!) 54  Resp: 16   Temp: 98.1 F (36.7 C) 98.2 F (36.8 C)  SpO2: 98% 100%   Vitals:   04/24/24 2137 04/25/24 0040 04/25/24 0439 04/25/24 1209  BP: 129/69 129/70 139/67 128/63  Pulse: (!) 59 60 (!) 59 (!) 54  Resp:  19 16   Temp: 99.1 F (37.3 C) 98 F (36.7 C) 98.1 F (36.7 C) 98.2 F (36.8 C)  TempSrc: Oral Oral    SpO2: 100% 96% 98% 100%  Weight:      Height:        General: Pt is alert, awake, not in acute distress Cardiovascular: RRR, S1/S2 +, no rubs, no gallops Respiratory: CTA bilaterally, no wheezing, no rhonchi Abdominal: Soft, NT, ND, bowel sounds + Extremities: no edema, no cyanosis    The results of significant diagnostics from this hospitalization (including imaging, microbiology, ancillary and laboratory) are listed below for reference.     Microbiology: No results found for this or  any previous visit (from the past 240 hours).   Labs: Basic Metabolic Panel: Recent Labs  Lab 04/23/24 1600 04/24/24 1042 04/25/24 0542  NA 131* 132* 131*  K 4.4 4.2 3.9  CL 96 100 99  CO2 21 22 25   GLUCOSE 88 107* 99  BUN 16 21 15   CREATININE 0.98 0.91 0.84  CALCIUM  8.6 9.5 8.7*   Liver Function Tests: Recent Labs  Lab 04/24/24 1042 04/25/24 0542  AST 16 14*  ALT 15 14  ALKPHOS 96 78  BILITOT 0.4 0.5  PROT 7.6 6.3*  ALBUMIN 4.1 3.5   No results for input(s): "LIPASE", "AMYLASE" in the last 168 hours. No results for input(s): "AMMONIA" in the last 168 hours. CBC: Recent Labs  Lab 04/23/24 1600 04/24/24 1042 04/25/24 0542  WBC 6.2 7.9 7.0  NEUTROABS 3.8 6.0  --   HGB 6.2* 6.7* 8.3*  HCT 21.6* 23.8* 28.7*  MCV 72* 72.3* 76.5*  PLT 260 271 247  Anemia work up Recent Labs    04/24/24 1134 04/24/24 1145 04/24/24 1200  VITAMINB12  --  121*  --   FOLATE 9.2  --   --   FERRITIN  --  3*  --   TIBC  --  469*  --   IRON  --  36*  --   RETICCTPCT  --   --  2.0   Urinalysis    Component Value Date/Time   COLORURINE STRAW (A) 04/24/2024 1819   APPEARANCEUR CLEAR 04/24/2024 1819   LABSPEC 1.008 04/24/2024 1819   PHURINE 6.0 04/24/2024 1819   GLUCOSEU NEGATIVE 04/24/2024 1819   HGBUR NEGATIVE 04/24/2024 1819   BILIRUBINUR NEGATIVE 04/24/2024 1819   KETONESUR NEGATIVE 04/24/2024 1819   PROTEINUR NEGATIVE 04/24/2024 1819   NITRITE NEGATIVE 04/24/2024 1819   LEUKOCYTESUR NEGATIVE 04/24/2024 1819   Sepsis Labs Recent Labs  Lab 04/23/24 1600 04/24/24 1042 04/25/24 0542  WBC 6.2 7.9 7.0   Microbiology No results found for this or any previous visit (from the past 240 hours).   Time coordinating discharge: Over 30 minutes  SIGNED:   Haydee Lipa, DO [facility] 04/25/2024, 12:30 PM Pager   If 7PM-7AM, please contact night-coverage [email or web address]

## 2024-04-26 ENCOUNTER — Encounter (HOSPITAL_COMMUNITY): Payer: Self-pay

## 2024-04-26 ENCOUNTER — Ambulatory Visit (HOSPITAL_COMMUNITY): Admit: 2024-04-26 | Admitting: Internal Medicine

## 2024-04-26 SURGERY — TRANSESOPHAGEAL ECHOCARDIOGRAM (TEE) (CATHLAB)
Anesthesia: Monitor Anesthesia Care

## 2024-04-26 SURGERY — RIGHT/LEFT HEART CATH AND CORONARY ANGIOGRAPHY
Anesthesia: LOCAL

## 2024-05-08 DIAGNOSIS — M51369 Other intervertebral disc degeneration, lumbar region without mention of lumbar back pain or lower extremity pain: Secondary | ICD-10-CM | POA: Diagnosis not present

## 2024-05-09 DIAGNOSIS — D649 Anemia, unspecified: Secondary | ICD-10-CM | POA: Diagnosis not present

## 2024-05-09 DIAGNOSIS — D509 Iron deficiency anemia, unspecified: Secondary | ICD-10-CM | POA: Diagnosis not present

## 2024-05-09 DIAGNOSIS — E538 Deficiency of other specified B group vitamins: Secondary | ICD-10-CM | POA: Diagnosis not present

## 2024-05-15 ENCOUNTER — Other Ambulatory Visit: Payer: Self-pay | Admitting: Acute Care

## 2024-05-15 DIAGNOSIS — Z87891 Personal history of nicotine dependence: Secondary | ICD-10-CM

## 2024-05-15 DIAGNOSIS — Z122 Encounter for screening for malignant neoplasm of respiratory organs: Secondary | ICD-10-CM

## 2024-05-15 DIAGNOSIS — F17210 Nicotine dependence, cigarettes, uncomplicated: Secondary | ICD-10-CM

## 2024-06-05 ENCOUNTER — Ambulatory Visit (HOSPITAL_COMMUNITY)
Admission: RE | Admit: 2024-06-05 | Discharge: 2024-06-05 | Disposition: A | Discharge status: Home / Self Care | Source: Ambulatory Visit | Attending: Acute Care | Admitting: Acute Care

## 2024-06-05 DIAGNOSIS — F17210 Nicotine dependence, cigarettes, uncomplicated: Secondary | ICD-10-CM | POA: Insufficient documentation

## 2024-06-05 DIAGNOSIS — Z87891 Personal history of nicotine dependence: Secondary | ICD-10-CM | POA: Insufficient documentation

## 2024-06-05 DIAGNOSIS — Z122 Encounter for screening for malignant neoplasm of respiratory organs: Secondary | ICD-10-CM | POA: Diagnosis not present

## 2024-06-06 ENCOUNTER — Other Ambulatory Visit: Payer: Self-pay | Admitting: Endocrinology

## 2024-06-06 DIAGNOSIS — E890 Postprocedural hypothyroidism: Secondary | ICD-10-CM

## 2024-06-11 ENCOUNTER — Other Ambulatory Visit: Payer: PPO

## 2024-06-11 DIAGNOSIS — E890 Postprocedural hypothyroidism: Secondary | ICD-10-CM | POA: Diagnosis not present

## 2024-06-12 ENCOUNTER — Ambulatory Visit: Payer: Self-pay | Admitting: Internal Medicine

## 2024-06-12 LAB — T4, FREE: Free T4: 1.3 ng/dL (ref 0.8–1.8)

## 2024-06-12 LAB — TSH: TSH: 2.22 m[IU]/L (ref 0.40–4.50)

## 2024-06-13 ENCOUNTER — Other Ambulatory Visit: Payer: Self-pay | Admitting: Acute Care

## 2024-06-13 DIAGNOSIS — Z122 Encounter for screening for malignant neoplasm of respiratory organs: Secondary | ICD-10-CM

## 2024-06-13 DIAGNOSIS — Z87891 Personal history of nicotine dependence: Secondary | ICD-10-CM

## 2024-06-13 DIAGNOSIS — F17210 Nicotine dependence, cigarettes, uncomplicated: Secondary | ICD-10-CM

## 2024-06-18 ENCOUNTER — Ambulatory Visit: Payer: PPO | Admitting: Endocrinology

## 2024-06-26 DIAGNOSIS — M5416 Radiculopathy, lumbar region: Secondary | ICD-10-CM | POA: Insufficient documentation

## 2024-07-09 ENCOUNTER — Encounter: Payer: Self-pay | Admitting: Endocrinology

## 2024-07-09 ENCOUNTER — Ambulatory Visit: Payer: PPO | Admitting: Endocrinology

## 2024-07-09 DIAGNOSIS — E890 Postprocedural hypothyroidism: Secondary | ICD-10-CM

## 2024-07-09 MED ORDER — LEVOTHYROXINE SODIUM 100 MCG PO TABS: 100.0000 ug | ORAL_TABLET | Freq: Every day | ORAL | 3 refills | Status: AC

## 2024-07-09 NOTE — Progress Notes (Unsigned)
 Outpatient Endocrinology Note [place] Xzaiver Vayda, MD   Patient's Name: Terry Miranda    DOB: 23-Mar-1951    MRN: [id number]  REASON OF VISIT: New consult / *** Follow-up for hypothyroidism  REFERRING PROVIDER:   PCP: Alben Therisa MATSU, PA  HISTORY OF PRESENT ILLNESS:   Terry Miranda is a 73 y.o. old male with past medical history as listed below is presented for a follow up /*** new consult for hypothyroidism.   Pertinent Thyroid  History: - Patient was diagnosed with hypothyroidism  in .  - Patient had symptoms of fatigue, hair issues, constipation, weight gain, cold intolerance and had thyroid  function which were consistent with hypothyroidism.  - Patient has been taking levothyroxine  since the diagnosis.  - No anterior neck compressive symptoms. - No family history of thyroid  disorder. - US  thyroid  in *** showed  - Labs: Patient had AntiTPO Ab positive.   No specialty comments available.   Interval history Patient is adherent with  levothyroxine . Patient is currently taking Levothyroxine  **** mcg daily.  Patient denies skipping any doses. No fatigue, weight changes. No palpitations, excessive sweating, heat or cold intolerance. No nausea, vomiting, diarrhea or constipation.  No pedal edema, no hand tremors and no skin and hair changes. No breathing difficulty, choking or dysphagia or noticeable masses in the neck.  ******** Patient has regular monthly menstrual cycles. No proptosis, redness, irritation and watering of eyes.   Thyroid  medications: @THYROIDDRUGS @    Latest Reference Range & Units 06/11/24 15:36  TSH 0.40 - 4.50 mIU/L 2.22  T4,Free(Direct) 0.8 - 1.8 ng/dL 1.3    REVIEW OF SYSTEMS:  As per history of present illness.   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   Left Lower Deltoid-Slight   Atypical nevus 08/26/2003   Upper Mid Right Back Sup-Slight to Moderate (w/s), Upper Mid Right Back Inf(Slight), Mid  Back-Slight to Moderate(w/s), Mid Right Back Sup-Slight to Moderate(w/s), and Mid Right Back Inf-Slight to Moderate(w/s)   Thyroid  disease     PAST SURGICAL HISTORY: Past Surgical History:  Procedure Laterality Date   COLONOSCOPY     HERNIA REPAIR      ALLERGIES: Allergies  Allergen Reactions   Amoxicillin Swelling    face swelled   Fish Oil Swelling    Face swelling   Saw Palmetto Swelling    Face swelling    FAMILY HISTORY:  Family History  Problem Relation Age of Onset   Thyroid  cancer Brother    Colon cancer Neg Hx    Diabetes Neg Hx     SOCIAL HISTORY: Social History   Socioeconomic History   Marital status: Married    Spouse name: Not on file   Number of children: Not on file   Years of education: Not on file   Highest education level: Not on file  Occupational History   Not on file  Tobacco Use   Smoking status: Every Day    Current packs/day: 1.00    Types: Cigarettes   Smokeless tobacco: Never  Substance and Sexual Activity   Alcohol use: Yes    Comment: rare   Drug use: No   Sexual activity: Not on file  Other Topics Concern   Not on file  Social History Narrative   Not on file   Social Drivers of Health   Financial Resource Strain: Not on file  Food Insecurity: No Food Insecurity (04/24/2024)   Hunger Vital Sign  Worried About Programme researcher, broadcasting/film/video in the Last Year: Never true    Ran Out of Food in the Last Year: Never true  Transportation Needs: No Transportation Needs (04/24/2024)   PRAPARE - Administrator, Civil Service (Medical): No    Lack of Transportation (Non-Medical): No  Physical Activity: Not on file  Stress: Not on file  Social Connections: Unknown (04/24/2024)   Social Connection and Isolation Panel    Frequency of Communication with Friends and Family: Patient declined    Frequency of Social Gatherings with Friends and Family: Patient declined    Attends Religious Services: Not on Energy manager or Organizations: Not on file    Attends Banker Meetings: Patient declined    Marital Status: Married    MEDICATIONS:  Current Outpatient Medications  Medication Sig Dispense Refill   acetaminophen  (TYLENOL ) 500 MG tablet Take 1,000 mg by mouth every 6 (six) hours as needed for moderate pain (pain score 4-6).     atorvastatin  (LIPITOR) 40 MG tablet Take 1 tablet (40 mg total) by mouth daily. 90 tablet 2   ferrous gluconate  (FERGON) 324 MG tablet Take 1 tablet (324 mg total) by mouth 2 (two) times daily with a meal. 60 tablet 0   ibuprofen (ADVIL) 200 MG tablet Take 400 mg by mouth every 6 (six) hours as needed for mild pain (pain score 1-3) or moderate pain (pain score 4-6).     levothyroxine  (SYNTHROID ) 100 MCG tablet Take 1 tablet (100 mcg total) by mouth daily. 45 tablet 2   tamsulosin  (FLOMAX ) 0.4 MG CAPS capsule TAKE 1 CAPSULE BY MOUTH EVERY DAY 90 capsule 3   VITAMIN D PO Take 1 capsule by mouth daily.     cyanocobalamin  1000 MCG tablet Take 1 tablet (1,000 mcg total) by mouth daily. (Patient not taking: Reported on 07/09/2024) 30 tablet 0   No current facility-administered medications for this visit.    PHYSICAL EXAM: Vitals:   07/09/24 1617  BP: 128/82  Pulse: 62  Resp: 18  SpO2: 97%  Weight: 186 lb 6.4 oz (84.6 kg)  Height: 5' 10 (1.778 m)   Body mass index is 26.75 kg/m.  Wt Readings from Last 3 Encounters:  07/09/24 186 lb 6.4 oz (84.6 kg)  04/24/24 190 lb (86.2 kg)  04/17/24 190 lb 3.2 oz (86.3 kg)    General: Well developed, well nourished male in no apparent distress.  HEENT: AT/[place], no external lesions. Hearing intact to the spoken word Eyes: EOMI. No stare, proptosis or lid lag. Conjunctiva clear and no icterus. Neck: Trachea midline, neck supple without appreciable thyromegaly or lymphadenopathy and no palpable thyroid  nodules Lungs: Clear to auscultation, no wheeze. Respirations not labored Heart: S1S2, Regular in rate and rhythm. No loud  murmurs Abdomen: Soft, non tender, non distended, no masses, no striae Neurologic: Alert, oriented, normal speech, deep tendon biceps reflexes normal,  no gross focal neurological deficit Extremities: No pedal pitting edema, no tremors of outstretched hands Skin: Warm, color good. No sores or rashes noted Psychiatric: Does not appear depressed or anxious  PERTINENT HISTORIC LABORATORY AND IMAGING STUDIES:  All pertinent laboratory results were reviewed. Please see HPI also for further details.   TSH  Date Value Ref Range Status  06/11/2024 2.22 0.40 - 4.50 mIU/L Final  06/16/2023 2.14 0.40 - 4.50 mIU/L Final  09/08/2022 2.26 0.35 - 5.50 uIU/mL Final     ASSESSMENT / PLAN  No diagnosis  found.    We discussed the medical need for compliance with levothyroxine  therapy, that it is a hormone necessary for life, and that serious consequences may result from noncompliance. Discussed the proper method of levothyroxine  administration: take on an empty stomach in the morning, with water, waiting thirty to sixty minutes before taking any other beverages or food. Also reviewed the need to take calcium  or iron  supplements or multivitamin (that may contain iron  or calcium ) at least 4 hours after levothyroxine  administration.  *** Hashimoto's thyroiditis is caused by antibodies that attack the thyroid  and destroy it. Approximately 90% of people with Hashimoto's thyroiditis have positive TPO antibodies, and about 50% have positive thyroglobulin antibodies. There are about 5% of patients that have no antibodies but diagnosed based on clinical and ultrasound features. Criteria for antibody negative Hashimoto's thyroiditis: 1.An ultrasound showing the characteristic hypoechoic pattern of Hashimoto's thyroiditis 2.two blood TSH levels >4.0 within 2-6 months of each other and 3. The absence of serum TPO or thyroglobulin antibodies on two occasions    Rotondi M, de Martinis L, Coperchini F, Pignatti P,  Pirali B, Ghilotti S, Fonte R, Magri F, Chiovato L. Serum negative autoimmune thyroiditis displays a milder clinical picture compared with classic Hashimoto's thyroiditis. Eur J Endocrinol. 2014   Treatment:(American thyroid  association) Generally treatment for Hashimoto's thyroiditis depends on your TSH and  free T4 level  -Patients with elevated TPO antibodies but normal thyroid  function tests (TSH and Free T4) do not require treatment.  -Patient with only a slightly elevated TSH (mild hypothyroidism) may not require medication and should have repeat testing after 3-6 months. -Patients with overt hypothyroidism (elevated TSH and low thyroid  hormone levels) treatment consists of thyroid  hormone replacement     There are no diagnoses linked to this encounter.  DISPOSITION Follow up in clinic in *** months suggested.  All questions answered and patient verbalized understanding of the plan.  [place] Devanie Galanti, MD [facility] Endocrinology [facility] [address] [place], [address] [place], [place] [address] Phone # [phone]  At least part of this note was generated using voice recognition software. Inadvertent word errors may have occurred, which were not recognized during the proofreading process.

## 2024-07-24 ENCOUNTER — Ambulatory Visit: Admitting: Cardiovascular Disease

## 2024-08-07 ENCOUNTER — Encounter: Payer: Self-pay | Admitting: Cardiovascular Disease

## 2024-08-07 ENCOUNTER — Ambulatory Visit: Attending: Cardiovascular Disease | Admitting: Cardiovascular Disease

## 2024-08-07 VITALS — BP 116/70 | HR 64 | Ht 70.0 in | Wt 187.0 lb

## 2024-08-07 DIAGNOSIS — R931 Abnormal findings on diagnostic imaging of heart and coronary circulation: Secondary | ICD-10-CM

## 2024-08-07 DIAGNOSIS — E785 Hyperlipidemia, unspecified: Secondary | ICD-10-CM | POA: Diagnosis not present

## 2024-08-07 DIAGNOSIS — I340 Nonrheumatic mitral (valve) insufficiency: Secondary | ICD-10-CM

## 2024-08-07 NOTE — Assessment & Plan Note (Signed)
 Patient returns after being scheduled for right left heart cath and TEE for severe MR and dyspnea.  It turned out that in his preop labs his hemoglobin was in the 6 range for unclear reasons.  He was transfused 2 units of packed red blood blood cells.  Workup revealed iron  deficiency anemia with decreased B12.  He denied melena.  After being transfused the symptoms have resolved.  His last echo a year ago did show normal LV systolic function with a mildly dilated LV.  I have asked him to go back to his PCP and pursue an etiology for his anemia.  While it may have been related to metabolic deficiencies I want to rule out other causes.  I am going to recheck a 2D echo and will see him back in January.  We will discuss working up his severe MR and consideration of mitral valve repair given his mildly dilated LV.

## 2024-08-07 NOTE — Progress Notes (Signed)
 08/07/2024 Terry Miranda   Jan 11, 1951  [id number]  Primary Physician Terry Therisa MATSU, PA Primary Cardiologist: Terry Miranda, [place]  HPI:  Terry Miranda is a 73 y.o.   thin appearing married Caucasian male father of 1 child with one grandchild on the way who is wife Terry Miranda  is also a patient of mine.  She accompanies him today.  He was referred by his primary care provider, Therisa Alben PA-C, for evaluation of asymptomatic bradycardia.  I last saw him in the office 04/17/2024.  He was found to have a slow heart rate by his occupational nurse at work. Does have a heart rate in the 50s. Has been told that he has a cardiac murmur. His risk factors include 50 pack years of tobacco abuse currently smoking 1 pack/day recalcitrant to risk factor modification. He has no other cardiac risk factors. Is never had a heart attack or stroke. He denies chest pain or shortness of breath. He does walk at work but does do organized exercise otherwise.   When I saw him 3 months ago he had developed progressive dyspnea on exertion over the last 3 months.  His echo performed a year ago did show normal LV systolic function, mild LV dilatation with severe MR and severe left atrial enlargement.  I think at this time he would benefit from mitral valve repair if possible.  I am referring him to the hospital for TEE/right left heart cath after which we will decide whether or not to refer him out for minimally invasive mitral valve repair.  Preprocedure labs prior to his TEE, right left heart cath revealed a hemoglobin in the 6 range.  There is no evidence of blood loss anemia.  His B12, iron  and iron  binding capacity were reduced.  He was transfused 2 units of red blood cells and placed on iron  repletion and B12.  His symptoms have resolved.  He has yet to see a hematologist, oncologist and GI for further workup.  Of note, his 2D echo performed 06/20/2023 did show normal LV systolic function with an LV  end-diastolic diameter 5.6 cm.   Current Meds  Medication Sig   acetaminophen  (TYLENOL ) 500 MG tablet Take 1,000 mg by mouth every 6 (six) hours as needed for moderate pain (pain score 4-6).   atorvastatin  (LIPITOR) 40 MG tablet Take 1 tablet (40 mg total) by mouth daily.   ferrous gluconate  (FERGON) 324 MG tablet Take 1 tablet (324 mg total) by mouth 2 (two) times daily with a meal.   ibuprofen (ADVIL) 200 MG tablet Take 400 mg by mouth every 6 (six) hours as needed for mild pain (pain score 1-3) or moderate pain (pain score 4-6).   levothyroxine  (SYNTHROID ) 100 MCG tablet Take 1 tablet (100 mcg total) by mouth daily.   tamsulosin  (FLOMAX ) 0.4 MG CAPS capsule TAKE 1 CAPSULE BY MOUTH EVERY DAY   VITAMIN D PO Take 1 capsule by mouth daily.     Allergies  Allergen Reactions   Amoxicillin Swelling    face swelled   Fish Oil Swelling    Face swelling   Saw Palmetto Swelling    Face swelling    Social History   Socioeconomic History   Marital status: Married    Spouse name: Not on file   Number of children: Not on file   Years of education: Not on file   Highest education level: Not on file  Occupational History   Not  on file  Tobacco Use   Smoking status: Every Day    Current packs/day: 1.00    Types: Cigarettes   Smokeless tobacco: Never  Substance and Sexual Activity   Alcohol use: Yes    Comment: rare   Drug use: No   Sexual activity: Not on file  Other Topics Concern   Not on file  Social History Narrative   Not on file   Social Drivers of Health   Financial Resource Strain: Not on file  Food Insecurity: No Food Insecurity (04/24/2024)   Hunger Vital Sign    Worried About Running Out of Food in the Last Year: Never true    Ran Out of Food in the Last Year: Never true  Transportation Needs: No Transportation Needs (04/24/2024)   PRAPARE - Administrator, Civil Service (Medical): No    Lack of Transportation (Non-Medical): No  Physical Activity: Not  on file  Stress: Not on file  Social Connections: Unknown (04/24/2024)   Social Connection and Isolation Panel    Frequency of Communication with Friends and Family: Patient declined    Frequency of Social Gatherings with Friends and Family: Patient declined    Attends Religious Services: Not on Insurance claims handler of Clubs or Organizations: Not on file    Attends Banker Meetings: Patient declined    Marital Status: Married  Catering manager Violence: Not At Risk (04/24/2024)   Humiliation, Afraid, Rape, and Kick questionnaire    Fear of Current or Ex-Partner: No    Emotionally Abused: No    Physically Abused: No    Sexually Abused: No     Review of Systems: General: negative for chills, fever, night sweats or weight changes.  Cardiovascular: negative for chest pain, dyspnea on exertion, edema, orthopnea, palpitations, paroxysmal nocturnal dyspnea or shortness of breath Dermatological: negative for rash Respiratory: negative for cough or wheezing Urologic: negative for hematuria Abdominal: negative for nausea, vomiting, diarrhea, bright red blood per rectum, melena, or hematemesis Neurologic: negative for visual changes, syncope, or dizziness All other systems reviewed and are otherwise negative except as noted above.    Blood pressure 116/70, pulse 64, height 5' 10 (1.778 m), weight 187 lb (84.8 kg), SpO2 95%.  General appearance: alert and no distress Neck: no adenopathy, no carotid bruit, no JVD, supple, symmetrical, trachea midline, and thyroid  not enlarged, symmetric, no tenderness/mass/nodules Lungs: clear to auscultation bilaterally Heart: 2/6 apical systolic murmur consistent with mitral regurgitation. Extremities: extremities normal, atraumatic, no cyanosis or edema Pulses: 2+ and symmetric Skin: Skin color, texture, turgor normal. No rashes or lesions Neurologic: Grossly normal  EKG EKG Interpretation Date/Time:  Wednesday August 07 2024 16:11:09  EDT Ventricular Rate:  63 PR Interval:  188 QRS Duration:  88 QT Interval:  428 QTC Calculation: 437 R Axis:   -13  Text Interpretation: Sinus rhythm with marked sinus arrhythmia Cannot rule out Inferior infarct , age undetermined When compared with ECG of 17-Apr-2024 16:09, Premature atrial complexes are no longer Present Borderline criteria for Anterior infarct are no longer Present Minimal criteria for Inferior infarct are now Present Confirmed by Court Carrier [phone]) on 08/07/2024 4:17:30 PM    ASSESSMENT AND PLAN:   Moderate mitral regurgitation Patient returns after being scheduled for right left heart cath and TEE for severe MR and dyspnea.  It turned out that in his preop labs his hemoglobin was in the 6 range for unclear reasons.  He was transfused 2 units of  packed red blood blood cells.  Workup revealed iron  deficiency anemia with decreased B12.  He denied melena.  After being transfused the symptoms have resolved.  His last echo a year ago did show normal LV systolic function with a mildly dilated LV.  I have asked him to go back to his PCP and pursue an etiology for his anemia.  While it may have been related to metabolic deficiencies I want to rule out other causes.  I am going to recheck a 2D echo and will see him back in January.  We will discuss working up his severe MR and consideration of mitral valve repair given his mildly dilated LV.     Terry DOROTHA Lesches MD FACP,FACC,FAHA, [facility] 08/07/2024 4:32 PM

## 2024-08-07 NOTE — Progress Notes (Signed)
 ekg

## 2024-08-07 NOTE — Patient Instructions (Signed)
 Medication Instructions:  Your physician recommends that you continue on your current medications as directed. Please refer to the Current Medication list given to you today.  *If you need a refill on your cardiac medications before your next appointment, please call your pharmacy*  Testing/Procedures: Your physician has requested that you have an echocardiogram. Echocardiography is a painless test that uses sound waves to create images of your heart. It provides your doctor with information about the size and shape of your heart and how well your heart's chambers and valves are working. This procedure takes approximately one hour. There are no restrictions for this procedure. Please do NOT wear cologne, perfume, aftershave, or lotions (deodorant is allowed). Please arrive 15 minutes prior to your appointment time.  Please note: We ask at that you not bring children with you during ultrasound (echo/ vascular) testing. Due to room size and safety concerns, children are not allowed in the ultrasound rooms during exams. Our front office staff cannot provide observation of children in our lobby area while testing is being conducted. An adult accompanying a patient to their appointment will only be allowed in the ultrasound room at the discretion of the ultrasound technician under special circumstances. We apologize for any inconvenience.   Follow-Up: At [facility], you and your health needs are our priority.  As part of our continuing mission to provide you with exceptional heart care, our providers are all part of one team.  This team includes your primary Cardiologist (physician) and Advanced Practice Providers or APPs (Physician Assistants and Nurse Practitioners) who all work together to provide you with the care you need, when you need it.  Your next appointment:   5-6 month(s)  Provider:   Dorn Lesches, MD   We recommend signing up for the patient portal called MyChart.  Sign  up information is provided on this After Visit Summary.  MyChart is used to connect with patients for Virtual Visits (Telemedicine).  Patients are able to view lab/test results, encounter notes, upcoming appointments, etc.  Non-urgent messages can be sent to your provider as well.   To learn more about what you can do with MyChart, go to [email or web address].

## 2024-08-09 ENCOUNTER — Ambulatory Visit: Admission: EM | Admit: 2024-08-09 | Discharge: 2024-08-09 | Disposition: A | Discharge status: Home / Self Care

## 2024-08-09 ENCOUNTER — Emergency Department (HOSPITAL_BASED_OUTPATIENT_CLINIC_OR_DEPARTMENT_OTHER)
Admission: EM | Admit: 2024-08-09 | Discharge: 2024-08-09 | Disposition: A | Discharge status: Home / Self Care | Source: Ambulatory Visit | Attending: Emergency Medicine | Admitting: Emergency Medicine

## 2024-08-09 DIAGNOSIS — E039 Hypothyroidism, unspecified: Secondary | ICD-10-CM | POA: Insufficient documentation

## 2024-08-09 DIAGNOSIS — F17210 Nicotine dependence, cigarettes, uncomplicated: Secondary | ICD-10-CM | POA: Diagnosis not present

## 2024-08-09 DIAGNOSIS — R040 Epistaxis: Secondary | ICD-10-CM

## 2024-08-09 LAB — CBC
HCT: 33.3 % — ABNORMAL LOW (ref 39.0–52.0)
Hemoglobin: 11.5 g/dL — ABNORMAL LOW (ref 13.0–17.0)
MCH: 31.5 pg (ref 26.0–34.0)
MCHC: 34.5 g/dL (ref 30.0–36.0)
MCV: 91.2 fL (ref 80.0–100.0)
Platelets: 220 K/uL (ref 150–400)
RBC: 3.65 MIL/uL — ABNORMAL LOW (ref 4.22–5.81)
RDW: 15.8 % — ABNORMAL HIGH (ref 11.5–15.5)
WBC: 10.4 K/uL (ref 4.0–10.5)
nRBC: 0 % (ref 0.0–0.2)

## 2024-08-09 MED ORDER — CLINDAMYCIN HCL 150 MG PO CAPS: 150.0000 mg | ORAL_CAPSULE | Freq: Four times a day (QID) | ORAL | 0 refills | Status: AC

## 2024-08-09 MED ORDER — OXYMETAZOLINE HCL 0.05 % NA SOLN
1.0000 | Freq: Once | NASAL | Status: AC
  Administered 2024-08-09: 1 via NASAL

## 2024-08-09 MED ORDER — LIDOCAINE-EPINEPHRINE (PF) 2 %-1:200000 IJ SOLN
10.0000 mL | Freq: Once | INTRAMUSCULAR | Status: AC
  Administered 2024-08-09: 10 mL
  Filled 2024-08-09: qty 20

## 2024-08-09 NOTE — ED Triage Notes (Signed)
 Upon sitting/waiting for intake/room explained the following:  First, confirmed no injury to nose/face, no rx blood thinners, advised to sit upright and lean your head slightly forward, then use your thumb and index finger to pinch the soft, fleshy part of your nose shut. Maintain this firm, continuous pressure for 10 to 15 minutes without checking to see if the bleeding has stopped. At the time of discussion (no/min. Bleeding).

## 2024-08-09 NOTE — Discharge Instructions (Addendum)
 As discussed, leave nasal packing in place until follow-up with ENT in the outpatient setting.  You saw Dr. Neoma last time so we will refer you back into his group.  Will also place antibiotics to prevent infection.

## 2024-08-09 NOTE — ED Provider Notes (Signed)
  EMERGENCY DEPARTMENT AT [facility] Provider Note   CSN: [id number] Arrival date & time: 08/09/24  2042     Patient presents with: Epistaxis   Terry Miranda is a 73 y.o. male.    Epistaxis   73 year old male presents emergency department with complaints of left-sided nosebleed.  States that he began to bleed spontaneously around 6 PM this evening.  Denies any known trauma/injury.  Patient states that he does not take any blood thinner, antiplatelet medications.  Tried Afrin at home without improvement prompting visit to the emergency department.  States that he did have to have nasal packing last time this happened.  Denies any bleeding elsewhere.  Presents emergency department for further assessment.  Past medical history significant for arthritis, hypothyroidism, hyperlipidemia, symptomatic anemia  Prior to Admission medications   Medication Sig Start Date End Date Taking? Authorizing Provider  acetaminophen  (TYLENOL ) 500 MG tablet Take 1,000 mg by mouth every 6 (six) hours as needed for moderate pain (pain score 4-6).    [provider]  albuterol (VENTOLIN HFA) 108 (90 Base) MCG/ACT inhaler Inhale 2 puffs into the lungs every 4 (four) hours as needed for wheezing or shortness of breath.    [provider]  atorvastatin  (LIPITOR) 40 MG tablet Take 1 tablet (40 mg total) by mouth daily. 11/19/21 08/07/24  Court Dorn PARAS, MD  benzonatate (TESSALON) 200 MG capsule Take 200 mg by mouth 3 (three) times daily as needed.    [provider]  cyanocobalamin  1000 MCG tablet Take 1 tablet (1,000 mcg total) by mouth daily. Patient not taking: Reported on 07/09/2024 04/26/24   Lue Elsie BROCKS, MD  ferrous gluconate  [facility]) 324 MG tablet Take 1 tablet (324 mg total) by mouth 2 (two) times daily with a meal. 04/25/24   Lue Elsie BROCKS, MD  ibuprofen (ADVIL) 200 MG tablet Take 400 mg by mouth every 6 (six) hours as needed for mild pain (pain  score 1-3) or moderate pain (pain score 4-6).    [provider]  ketorolac (ACULAR) 0.5 % ophthalmic solution 1 drop as directed.    [provider]  levothyroxine  (SYNTHROID ) 100 MCG tablet Take 1 tablet (100 mcg total) by mouth daily. 07/09/24   Thapa, [place], MD  prednisoLONE acetate (PRED FORTE) 1 % ophthalmic suspension Place 1 drop into the left eye 4 (four) times daily.    [provider]  predniSONE (DELTASONE) 10 MG tablet Take 10 mg by mouth as directed. 06/26/24   [provider]  sildenafil (VIAGRA) 100 MG tablet Take 100 mg by mouth daily. 07/01/24   [provider]  tamsulosin  (FLOMAX ) 0.4 MG CAPS capsule TAKE 1 CAPSULE BY MOUTH EVERY DAY 12/19/19   McKenzie, Belvie CROME, MD  VITAMIN D PO Take 1 capsule by mouth daily.    [provider]    Allergies: Amoxicillin, Fish oil, Ibuprofen, and Saw palmetto    Review of Systems  HENT:  Positive for nosebleeds.   All other systems reviewed and are negative.   Updated Vital Signs BP 131/83   Pulse 98   Temp 98.1 F (36.7 C) (Oral)   Resp 18   SpO2 100%   Physical Exam Vitals and nursing note reviewed.  Constitutional:      General: He is not in acute distress.    Appearance: He is well-developed.  HENT:     Head: Normocephalic and atraumatic.     Nose:     Comments: Steady flow of  blood from left nare.  Clots appreciated bilateral nare.  Unable to visualize direct source of bleeding. Eyes:     Conjunctiva/sclera: Conjunctivae normal.  Cardiovascular:     Rate and Rhythm: Normal rate and regular rhythm.  Pulmonary:     Effort: Pulmonary effort is normal. No respiratory distress.     Breath sounds: Normal breath sounds.  Abdominal:     Palpations: Abdomen is soft.     Tenderness: There is no abdominal tenderness.  Musculoskeletal:        General: No swelling.     Cervical back: Neck supple.  Skin:    General: Skin is warm and dry.     Capillary Refill: Capillary  refill takes less than 2 seconds.  Neurological:     Mental Status: He is alert.  Psychiatric:        Mood and Affect: Mood normal.     (all labs ordered are listed, but only abnormal results are displayed) Labs Reviewed  CBC - Abnormal; Notable for the following components:      Result Value   RBC 3.65 (*)    Hemoglobin 11.5 (*)    HCT 33.3 (*)    RDW 15.8 (*)    All other components within normal limits    EKG: None  Radiology: No results found.   Epistaxis Management  Date/Time: 08/09/2024 9:46 PM  Performed by: Silver Wonda LABOR, PA Authorized by: Silver Wonda LABOR, PA   Consent:    Consent obtained:  Verbal   Consent given by:  Patient   Risks, benefits, and alternatives were discussed: yes     Risks discussed:  Bleeding, infection, nasal injury and pain   Alternatives discussed:  No treatment, delayed treatment and alternative treatment Universal protocol:    Procedure explained and questions answered to patient or proxy's satisfaction: yes     Relevant documents present and verified: yes     Test results available: yes     Patient identity confirmed:  Verbally with patient Anesthesia:    Anesthesia method:  Topical application   Topical anesthesia: Lidocaine  with epinephrine . Procedure details:    Treatment site:  L anterior and L posterior   Treatment method:  Nasal balloon   Treatment episode: recurring   Post-procedure details:    Assessment:  Bleeding stopped   Procedure completion:  Tolerated well, no immediate complications Comments:     Initial trial epistaxis management was intranasal Afrin bilaterally with pressure x 3 for 10 to 15 minutes without improvement.  Subsequently, lidocaine  with epinephrine  soaked anterior posterior Rhino Rocket was inserted into the left nare with initial slightly improved amount of bleeding.  Balloon was subsequently adjusted and inflated more with achieved hemostasis.  Patient observed for 30 minutes post hemostasis  without any signs of bleeding.  Will discharge with nasal packing in place follow-up with ENT.    Medications Ordered in the ED  lidocaine -EPINEPHrine  (XYLOCAINE  W/EPI) 2 %-1:200000 (PF) injection 10 mL (10 mLs Infiltration Given 08/09/24 2119)  oxymetazoline  (AFRIN) 0.05 % nasal spray 1 spray (1 spray Each Nare Given 08/09/24 2118)                                    Medical Decision Making Amount and/or Complexity of Data Reviewed Labs: ordered.  Risk OTC drugs. Prescription drug management.   This patient presents to the ED for concern of epistaxis, this involves an extensive number  of treatment options, and is a complaint that carries with it a high risk of complications and morbidity.  The differential diagnosis includes coagulopathy, anterior/posterior epistaxis, other   Co morbidities that complicate the patient evaluation  See HPI   Additional history obtained:  Additional history obtained from EMR External records from outside source obtained and reviewed including hospital records   Lab Tests:  I Ordered, and personally interpreted labs.  The pertinent results include: No leukocytosis.  Anemia hemoglobin 11.5 which is near patient's baseline.  Platelets within range.   Imaging Studies ordered:  N/a   Cardiac Monitoring: / EKG:  N/a   Consultations Obtained:  N/a   Problem List / ED Course / Critical interventions / Medication management  Epistaxis I ordered medication including Afrin, lidocaine  with epinephrine    Reevaluation of the patient after these medicines showed that the patient improved I have reviewed the patients home medicines and have made adjustments as needed   Social Determinants of Health:  Chronic cigarette use.  Denies illicit drug use.   Test / Admission - Considered:  Epistaxis Vitals signs within normal range and stable throughout visit. Laboratory studies significant for: See above  73 year old male presents  emergency department with complaints of left-sided nosebleed.  States that he began to bleed spontaneously around 6 PM this evening.  Denies any known trauma/injury.  Patient states that he does not take any blood thinner, antiplatelet medications.  Tried Afrin at home without improvement prompting visit to the emergency department.  States that he did have to have nasal packing last time this happened.  Denies any bleeding elsewhere.  Presents emergency department for further assessment. On exam initially, patient with bleeding appreciated from left nare.  Unable to visualize direct source whether it was anterior posterior due to minor bleeding and clots present.  Initial trial of Afrin x 3 with anterior packing/pressure with slight improvement of symptoms but still with bleeding present.  Initially elected for Rhino Rocket soaked in lidocaine  with epinephrine  with eventual success in achieving hemostasis.  Patient observed for 40 minutes post hemostasis without any recurrence.  Will prophylactically place patient on antibiotics and refer back into ENT as he has already seen Dr. Karis before in the past for similar symptoms.  Treatment plan discussed with patient he acknowledged understanding was agreeable.  Patient well-appearing, afebrile in no acute distress. Worrisome signs and symptoms were discussed with the patient, and the patient acknowledged understanding to return to the ED if noticed. Patient was stable upon discharge.       Final diagnoses:  None    ED Discharge Orders     None          Silver Wonda LABOR, [place] 08/09/24 2217    Lenor Hollering, MD 08/09/24 2328

## 2024-08-09 NOTE — ED Triage Notes (Signed)
 Nosebleed x2 hrs. Hx of bleed in past needing packing and cautery. Low HGB on labs several months ago. No thinners.

## 2024-08-09 NOTE — ED Triage Notes (Signed)
 Here with Spouse/Wife. This nose bleed started on the way home around 645/7pm and continues mainly on left side with some bleeding from left eye?SABRA History of this with blood loss/anemia and packing.

## 2024-08-09 NOTE — ED Provider Notes (Signed)
 [facility]    CSN: [id number] Arrival date & time: 08/09/24  1946      History   Chief Complaint Chief Complaint  Patient presents with   Epistaxis    HPI Terry Miranda is a 73 y.o. male.   Patient presents with a nosebleed that began around 6:30 this evening without any associated injury. He has a history of recurrent epistaxis, with prior episodes requiring packing and cautery. He is not currently taking aspirin or anticoagulant medications. The bleeding is coming from the left nostril and is accompanied by some discomfort in the left eye. He denies nasal congestion, headache, dizziness, or recent upper respiratory infection.   The following portions of the patient's history were reviewed and updated as appropriate: allergies, current medications, past family history, past medical history, past social history, past surgical history, and problem list.    Past Medical History:  Diagnosis Date   Arthritis    Atypical nevus 02/11/2008   Left Lower Deltoid-Slight   Atypical nevus 08/26/2003   Upper Mid Right Back Sup-Slight to Moderate (w/s), Upper Mid Right Back Inf(Slight), Mid Back-Slight to Moderate(w/s), Mid Right Back Sup-Slight to Moderate(w/s), and Mid Right Back Inf-Slight to Moderate(w/s)   Thyroid  disease     Patient Active Problem List   Diagnosis Date Noted   Lumbar radiculopathy 06/26/2024   Symptomatic anemia 04/24/2024   B12 deficiency 04/24/2024   Iron  deficiency anemia 04/24/2024   Tobacco abuse 04/17/2024   Abdominal aortic atherosclerosis (HCC) 10/16/2023   Degeneration of lumbar intervertebral disc 10/16/2023   Low back pain 09/19/2023   Pain of both hip joints 09/19/2023   Moderate mitral regurgitation 02/24/2023   Hyperlipidemia 02/24/2023   Elevated coronary artery calcium  score 02/24/2023   Pain of cervical spine 05/05/2022   Slow heart rate 04/27/2021   Cardiac murmur 04/27/2021   Hypothyroidism, 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 Authorizing Provider  predniSONE (DELTASONE) 10 MG tablet Take 10 mg by mouth as directed. 06/26/24  Yes [provider]  sildenafil (VIAGRA) 100 MG tablet Take 100 mg by mouth daily. 07/01/24  Yes [provider]  acetaminophen  (TYLENOL ) 500 MG tablet Take 1,000 mg by mouth every 6 (six) hours as needed for moderate pain (pain score 4-6).    [provider]  albuterol (VENTOLIN HFA) 108 (90 Base) MCG/ACT inhaler Inhale 2 puffs into the lungs every 4 (four) hours as needed for wheezing or shortness of breath.    [provider]  atorvastatin  (LIPITOR) 40 MG tablet Take 1 tablet (40 mg total) by mouth daily. 11/19/21 08/07/24  Court Dorn PARAS, MD  benzonatate (TESSALON) 200 MG capsule Take 200 mg by mouth 3 (three) times daily as needed.    [provider]  cyanocobalamin  1000 MCG tablet Take 1 tablet (1,000 mcg total) by mouth daily. Patient not taking: Reported on 07/09/2024 04/26/24   Lue Elsie BROCKS, MD  ferrous gluconate  [facility]) 324 MG tablet Take 1 tablet (324 mg total) by mouth 2 (two) times daily with a meal. 04/25/24   Lue Elsie BROCKS, MD  ibuprofen (ADVIL) 200 MG tablet Take 400 mg by mouth every 6 (six) hours as needed for mild pain (pain score 1-3) or  moderate pain (pain score 4-6).    [provider]  ketorolac (ACULAR) 0.5 % ophthalmic solution 1 drop as directed.    [provider]  levothyroxine  (SYNTHROID ) 100 MCG tablet Take 1 tablet (100 mcg total) by mouth daily. 07/09/24   Thapa, [place], MD  prednisoLONE acetate (PRED FORTE) 1 % ophthalmic suspension Place 1 drop into the left eye 4 (four) times daily.    [provider]  tamsulosin  (FLOMAX ) 0.4 MG  CAPS capsule TAKE 1 CAPSULE BY MOUTH EVERY DAY 12/19/19   McKenzie, Belvie CROME, MD  VITAMIN D PO Take 1 capsule by mouth daily.    [provider]    Family History Family History  Problem Relation Age of Onset   Thyroid  cancer Brother    Colon cancer Neg Hx    Diabetes Neg Hx     Social History Social History   Tobacco Use   Smoking status: Every Day    Current packs/day: 1.00    Types: Cigarettes   Smokeless tobacco: Never  Vaping Use   Vaping status: Never Used  Substance Use Topics   Alcohol use: Yes    Comment: rare   Drug use: No     Allergies   Amoxicillin, Fish oil, Ibuprofen, and Saw palmetto   Review of Systems Review of Systems  HENT:  Positive for nosebleeds. Negative for congestion.   Gastrointestinal:  Negative for nausea and vomiting.  Neurological:  Negative for dizziness and headaches.  All other systems reviewed and are negative.    Physical Exam Triage Vital Signs ED Triage Vitals  Encounter Vitals Group     BP 08/09/24 2001 128/76     Girls Systolic BP Percentile --      Girls Diastolic BP Percentile --      Boys Systolic BP Percentile --      Boys Diastolic BP Percentile --      Pulse Rate 08/09/24 2001 87     Resp 08/09/24 2001 18     Temp 08/09/24 2001 98.3 F (36.8 C)     Temp Source 08/09/24 2001 Temporal     SpO2 08/09/24 2001 95 %     Weight 08/09/24 2004 186 lb 15.2 oz (84.8 kg)     Height 08/09/24 2004 5' 10 (1.778 m)     Head Circumference --      Peak Flow --      Pain Score 08/09/24 2004 0     Pain Loc --      Pain Education --      Exclude from Growth Chart --    No data found.  Updated Vital Signs BP 128/76 (BP Location: Left Arm)   Pulse 87   Temp 98.3 F (36.8 C) (Temporal)   Resp 18   Ht 5' 10 (1.778 m)   Wt 186 lb 15.2 oz (84.8 kg)   SpO2 95%   BMI 26.82 kg/m   Visual Acuity Right Eye Distance:   Left Eye Distance:   Bilateral Distance:    Right Eye Near:   Left Eye Near:    Bilateral  Near:     Physical Exam Vitals reviewed.  Constitutional:      General: He is awake. He is not in acute distress.    Appearance: Normal appearance. He is well-developed. He is not ill-appearing, toxic-appearing or diaphoretic.  HENT:     Head: Normocephalic.     Right Ear: Hearing normal.     Left Ear: Hearing normal.  Nose:     Left Nostril: Epistaxis present.     Mouth/Throat:     Mouth: Mucous membranes are moist.  Eyes:     General: Vision grossly intact.     Conjunctiva/sclera: Conjunctivae normal.     Comments: Scant bleeding noted from the inner canthus of the left eye. No conjunctiva injection noted   Cardiovascular:     Rate and Rhythm: Normal rate and regular rhythm.     Heart sounds: Normal heart sounds.  Pulmonary:     Effort: Pulmonary effort is normal.     Breath sounds: Normal breath sounds and air entry.  Musculoskeletal:        General: Normal range of motion.     Cervical back: Full passive range of motion without pain, normal range of motion and neck supple.  Skin:    General: Skin is warm and dry.  Neurological:     General: No focal deficit present.     Mental Status: He is alert and oriented to person, place, and time.  Psychiatric:        Speech: Speech normal.        Behavior: Behavior is cooperative.      UC Treatments / Results  Labs (all labs ordered are listed, but only abnormal results are displayed) Labs Reviewed - No data to display  EKG   Radiology No results found.  Procedures Procedures (including critical care time)  Medications Ordered in UC Medications - No data to display  Initial Impression / Assessment and Plan / UC Course  I have reviewed the triage vital signs and the nursing notes.  Pertinent labs & imaging results that were available during my care of the patient were reviewed by me and considered in my medical decision making (see chart for details).     Patient presents with acute left-sided epistaxis  beginning this evening without trauma. He has a history of recurrent nosebleeds requiring cautery and packing. He is not on anticoagulants or aspirin. Initial management in clinic included having the patient blow his nose, administration of Afrin, application of direct pressure, and ice packs. Despite these measures, bleeding persisted. Given his recurrent history and inability to control the bleeding in the clinic, he was advised to proceed to the emergency department for further evaluation and management, including possible cauterization or packing. He remains hemodynamically stable and is appropriate for transport via private vehicle with his wife. He was instructed to continue holding pressure and applying ice until arrival at the ED.  The patient presents with symptoms and exam findings that warrant a higher level of care and further evaluation. Given the clinical picture, emergency department (ED) assessment is recommended for advanced diagnostics and management. The patient is currently stable and appropriate for transport via private vehicle. A responsible adult will be driving. The patient is alert, oriented, demonstrates clear mental status, good insight into their illness, and sound judgment in making decisions regarding their care. The risks, rationale, and need for ED evaluation were discussed, and the patient is agreeable to the plan.  Documentation was completed with the aid of voice recognition software. Transcription may contain typographical errors. Final Clinical Impressions(s) / UC Diagnoses   Final diagnoses:  Epistaxis   Discharge Instructions   None    ED Prescriptions   None    PDMP not reviewed this encounter.   Iola Lukes, [place] 08/09/24 2018

## 2024-08-09 NOTE — ED Notes (Signed)
 Patient is being discharged from the Urgent Care and sent to the Emergency Department via POV . Per Lucie Sarin, NP, patient is in need of higher level of care due to active nosebleed. Patient is aware and verbalizes understanding of plan of care.  Vitals:   08/09/24 2001  BP: 128/76  Pulse: 87  Resp: 18  Temp: 98.3 F (36.8 C)  SpO2: 95%

## 2024-08-13 ENCOUNTER — Encounter (INDEPENDENT_AMBULATORY_CARE_PROVIDER_SITE_OTHER): Payer: Self-pay | Admitting: Otolaryngology

## 2024-08-13 ENCOUNTER — Telehealth (INDEPENDENT_AMBULATORY_CARE_PROVIDER_SITE_OTHER): Payer: Self-pay | Admitting: Otolaryngology

## 2024-08-13 ENCOUNTER — Ambulatory Visit (INDEPENDENT_AMBULATORY_CARE_PROVIDER_SITE_OTHER): Admitting: Otolaryngology

## 2024-08-13 VITALS — BP 134/75 | HR 61 | Ht 70.0 in | Wt 186.0 lb

## 2024-08-13 DIAGNOSIS — R040 Epistaxis: Secondary | ICD-10-CM | POA: Insufficient documentation

## 2024-08-13 NOTE — Progress Notes (Signed)
 CC: Recurrent nosebleeds  HPI:  Terry Miranda is a 73 y.o. male who presents today complaining of frequent recurrent epistaxis.  According to the patient, he has been experiencing frequent nose bleeds for the past few years.  He underwent left nasal cautery 4 years ago.  Four days ago, he had an episode of severe left anterior and posterior epistaxis.  He was unable to control the bleeding.  He was seen at the [facility]  The bleeding was eventually controlled with a nasal packing.  He has no previous history of ENT surgery.  He denies any family history of coagulation disorder.   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 He has never used smokeless tobacco. He reports current alcohol use. He reports that he does not use drugs.  Allergies:  Allergies  Allergen Reactions   Amoxicillin Swelling    face swelled   Fish Oil Swelling    Face swelling  fish oils   Ibuprofen Swelling    Face swell   Saw Palmetto Swelling    Face swelling  saw palmetto extract    Prior to Admission medications   Medication Sig Start Date End Date Taking? Authorizing Provider  acetaminophen  (TYLENOL ) 500 MG tablet Take 1,000 mg by mouth every 6 (six) hours as needed for moderate pain (pain score 4-6).   Yes [provider]  albuterol (VENTOLIN HFA) 108 (90 Base) MCG/ACT inhaler Inhale 2 puffs into  the lungs every 4 (four) hours as needed for wheezing or shortness of breath.   Yes [provider]  atorvastatin  (LIPITOR) 40 MG tablet Take 1 tablet (40 mg total) by mouth daily. 11/19/21 08/13/24 Yes Court Dorn PARAS, MD  benzonatate (TESSALON) 200 MG capsule Take 200 mg by mouth 3 (three) times daily as needed.   Yes [provider]  clindamycin  (CLEOCIN ) 150 MG capsule Take 1 capsule (150 mg total) by mouth every 6 (six) hours for 5 days. 08/09/24 08/14/24 Yes Silver Fell A, PA  cyanocobalamin  1000 MCG tablet Take 1 tablet (1,000 mcg total) by mouth daily. 04/26/24  Yes Lue Elsie BROCKS, MD  ferrous gluconate  [facility]) 324 MG tablet Take 1 tablet (324 mg total) by mouth 2 (two) times daily with a meal. 04/25/24  Yes Lue Elsie BROCKS, MD  ibuprofen (ADVIL) 200 MG tablet Take 400 mg by mouth every 6 (six) hours as needed for mild pain (pain score 1-3) or moderate pain (pain score 4-6).   Yes [provider]  ketorolac (ACULAR) 0.5 % ophthalmic solution 1 drop as directed.   Yes [provider]  levothyroxine  (SYNTHROID ) 100 MCG tablet Take 1 tablet (100 mcg total) by mouth daily. 07/09/24  Yes Thapa, [place], MD  prednisoLONE acetate (PRED FORTE) 1 % ophthalmic suspension Place 1 drop into the left eye 4 (  four) times daily.   Yes [provider]  predniSONE (DELTASONE) 10 MG tablet Take 10 mg by mouth as directed. 06/26/24  Yes [provider]  sildenafil (VIAGRA) 100 MG tablet Take 100 mg by mouth daily. 07/01/24  Yes [provider]  tamsulosin  (FLOMAX ) 0.4 MG CAPS capsule TAKE 1 CAPSULE BY MOUTH EVERY DAY 12/19/19  Yes McKenzie, Belvie CROME, MD  VITAMIN D PO Take 1 capsule by mouth daily.   Yes [provider]    Blood pressure 134/75, pulse 61, height 5' 10 (1.778 m), weight 186 lb (84.4 kg), SpO2 98%. Exam: General: Communicates without difficulty, well nourished, no acute distress. Head: Normocephalic, no evidence injury,  no tenderness, facial buttresses intact without stepoff. Face/sinus: No tenderness to palpation and percussion. Facial movement is normal and symmetric. Eyes: PERRL, EOMI. No scleral icterus, conjunctivae clear. Neuro: CN II exam reveals vision grossly intact.  No nystagmus at any point of gaze. Ears: Auricles well formed without lesions.  Ear canals are intact without mass or lesion.  No erythema or edema is appreciated.  The TMs are intact without fluid. Nose: External evaluation reveals normal support and skin without lesions.  Dorsum is intact.  The left nasal packing is removed without difficulty.  Endoscopic evaluation of the left nasal cavity reveals several bleeding sources on the nasal septum and inferior turbinates. Oral:  Oral cavity and oropharynx are intact, symmetric, without erythema or edema.  Mucosa is moist without lesions. Neck: Full range of motion without pain.  There is no significant lymphadenopathy.  No masses palpable.  Thyroid  bed within normal limits to palpation.  Parotid glands and submandibular glands equal bilaterally without mass.  Trachea is midline. Neuro:  CN 2-12 grossly intact. Gait normal.   Procedure:  Endoscopic control of recurrent left epistaxis. Indication:  Recurrent epistaxis  Description:  The left nasal cavity is sprayed with topical xylocaine  and neo-synephrine.  After adequate anesthesia is achieved, the nasal cavity is examined with a 0 rigid endoscope.  A suction catheter is inserted into parallel with the 0 endoscope, and it is used to suction the blood clots from the nasal cavity.  Several hypervascular areas are noted on the left anterior and middle portion of the septum and the inferior turbinate. Active bleeding is noted. A silver nitrate stick is inserted in parallel with the 0 endoscope.  It is used to repeatedly cauterized the hypervascular areas.  Good hemostasis is achieved.  The patient tolerated the procedure well.  Assessment: 1.  The left  nasal packing is removed without difficulty.  Endoscopic evaluation of the left nasal cavity reveals several bleeding sources on the nasal septum and inferior turbinates.  2.  No suspicious mass or lesion is noted today.   Plan: 1.  Endoscopic cauterization of the left nasal septum and inferior turbinates.  2.  The physical exam and nasal endoscopy findings are reviewed with the patient.  3.  The patient is encouraged to apply saline gel to his nasal cavities.  4.  The patient will return for re-evaluation in 1 month.  Callen Zuba W Abree Romick 08/13/2024, 12:57 PM

## 2024-08-13 NOTE — Telephone Encounter (Signed)
 The patient's spouse called in reporting that the patient's nose has started to ooze a bit.  I placed her on hold and consulted Dr Karis, he said for the patient to use afrin and that should help.  If he continues to ooze long term he can call and come in for a follow up.  I relayed this information to the patient's spouse and she voiced understanding.

## 2024-09-11 ENCOUNTER — Ambulatory Visit (HOSPITAL_COMMUNITY)
Admission: RE | Admit: 2024-09-11 | Discharge: 2024-09-11 | Disposition: A | Discharge status: Home / Self Care | Source: Ambulatory Visit | Attending: Cardiovascular Disease | Admitting: Cardiovascular Disease

## 2024-09-11 DIAGNOSIS — E785 Hyperlipidemia, unspecified: Secondary | ICD-10-CM | POA: Insufficient documentation

## 2024-09-11 DIAGNOSIS — I340 Nonrheumatic mitral (valve) insufficiency: Secondary | ICD-10-CM | POA: Insufficient documentation

## 2024-09-11 LAB — ECHOCARDIOGRAM COMPLETE
AR max vel: 2.32 cm2
AV Area VTI: 1.99 cm2
AV Area mean vel: 1.93 cm2
AV Mean grad: 6 mmHg
AV Peak grad: 11.3 mmHg
Ao pk vel: 1.68 m/s
Area-P 1/2: 3.66 cm2
MV M vel: 6.44 m/s
MV Peak grad: 165.9 mmHg
MV VTI: 1.5 cm2
P 1/2 time: 511 ms
Radius: 1.5 cm
S' Lateral: 4.4 cm

## 2024-09-12 ENCOUNTER — Ambulatory Visit: Payer: Self-pay | Admitting: Cardiovascular Disease

## 2024-09-18 ENCOUNTER — Encounter (INDEPENDENT_AMBULATORY_CARE_PROVIDER_SITE_OTHER): Payer: Self-pay | Admitting: Otolaryngology

## 2024-09-18 ENCOUNTER — Ambulatory Visit (INDEPENDENT_AMBULATORY_CARE_PROVIDER_SITE_OTHER): Admitting: Otolaryngology

## 2024-09-18 VITALS — BP 132/60 | HR 56 | Temp 97.9°F | Ht 70.0 in | Wt 182.0 lb

## 2024-09-18 DIAGNOSIS — R040 Epistaxis: Secondary | ICD-10-CM | POA: Diagnosis not present

## 2024-09-18 NOTE — Progress Notes (Signed)
 Patient ID: Terry Miranda, male   DOB: 1951/04/16, 73 y.o.   MRN: [id number]  Follow-up: Recurrent left epistaxis, chronic nasal obstruction  Procedure:  Endoscopic control of recurrent left epistaxis and debridement of the left nasal cavity  Indication: The patient is a 73 year old male who returns today for his follow-up evaluation.  He was previously seen for recurrent left epistaxis.  Hypervascular areas were noted on his left nasal septum and the inferior turbinate.  He was treated with a cauterization procedure.  The patient returns today complaining of significant left nasal obstruction after the cauterization procedure.  On examination, a large amount of crusting is noted within the left nasal cavity.  Hypervascular areas were again noted on the left nasal septum.  The decision was therefore made to proceed with endoscopic debridement of his left nasal cavity and cauterization of the left nasal septum.  Description:  The left nasal cavity is sprayed with topical xylocaine  and neo-synephrine.  After adequate anesthesia is achieved, the nasal cavity is examined with a 0 rigid endoscope.  A large amount of crusting is carefully debrided with a combination of suction catheter and alligator forceps.  Hypervascular areas are again noted at the anterior and superior aspect of the nasal septum.  A silver nitrate stick is inserted in parallel with the 0 endoscope.  It is used to repeatedly cauterized the hypervascular areas.  Good hemostasis is achieved.  The patient tolerated the procedure well.  Assessment: 1.  Hypervascular areas are noted on the left anterior and superior nasal septum.   2.  A large amount of crusting is also noted within the left nasal cavity.  It is debrided without difficulty.  Plan: 1. Endoscopic debridement of the left nasal cavity and cauterization of the left anterior and superior nasal septum. 2. The nasal endoscopy findings are reviewed with the patient. 3.  Nasal saline  irrigation daily. 4. The patient will return for re-evaluation in 1 month.

## 2024-09-23 ENCOUNTER — Ambulatory Visit: Attending: Cardiovascular Disease | Admitting: Cardiovascular Disease

## 2024-09-23 ENCOUNTER — Encounter: Payer: Self-pay | Admitting: Cardiovascular Disease

## 2024-09-23 VITALS — BP 106/68 | HR 84 | Ht 70.0 in | Wt 189.8 lb

## 2024-09-23 DIAGNOSIS — I340 Nonrheumatic mitral (valve) insufficiency: Secondary | ICD-10-CM | POA: Diagnosis not present

## 2024-09-23 NOTE — Patient Instructions (Signed)
 Medication Instructions:  Your physician recommends that you continue on your current medications as directed. Please refer to the Current Medication list given to you today.  *If you need a refill on your cardiac medications before your next appointment, please call your pharmacy*   Follow-Up: At [facility], you and your health needs are our priority.  As part of our continuing mission to provide you with exceptional heart care, our providers are all part of one team.  This team includes your primary Cardiologist (physician) and Advanced Practice Providers or APPs (Physician Assistants and Nurse Practitioners) who all work together to provide you with the care you need, when you need it.  Your next appointment:   3 month(s)  Provider:   Dorn Lesches, MD

## 2024-09-23 NOTE — Progress Notes (Signed)
 Terry Miranda returns today for follow-up.  He remains asymptomatic after being transfused secondary to his severe anemia.  He does continue to smoke.  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  His diastolic dimensions are 6.5 cm.  I believe he would benefit from mitral valve repair.  I told him that the 2 things that need to occur before we proceed down the path of TEE, right left heart cath and referral to [facility] is that he needs to stop smoking and we need to get a complete workup addressing why he was anemic including hematology oncology and GI.  I will see him back in 3 months for follow-up.  Dorn DOROTHA Lesches, M.D., FACP, [facility], FAHA, [facility]  [address], [address] [place], [place]  [address]  [phone] 09/23/2024 4:11 PM

## 2024-09-25 ENCOUNTER — Inpatient Hospital Stay: Attending: Internal Medicine | Admitting: Internal Medicine

## 2024-09-25 ENCOUNTER — Inpatient Hospital Stay

## 2024-09-25 VITALS — BP 126/82 | HR 56 | Temp 97.4°F | Resp 17 | Ht 70.0 in | Wt 190.6 lb

## 2024-09-25 DIAGNOSIS — Z880 Allergy status to penicillin: Secondary | ICD-10-CM | POA: Diagnosis not present

## 2024-09-25 DIAGNOSIS — F17210 Nicotine dependence, cigarettes, uncomplicated: Secondary | ICD-10-CM | POA: Diagnosis not present

## 2024-09-25 DIAGNOSIS — Z8042 Family history of malignant neoplasm of prostate: Secondary | ICD-10-CM | POA: Diagnosis not present

## 2024-09-25 DIAGNOSIS — D649 Anemia, unspecified: Secondary | ICD-10-CM | POA: Diagnosis not present

## 2024-09-25 DIAGNOSIS — M1990 Unspecified osteoarthritis, unspecified site: Secondary | ICD-10-CM | POA: Diagnosis not present

## 2024-09-25 DIAGNOSIS — Z808 Family history of malignant neoplasm of other organs or systems: Secondary | ICD-10-CM | POA: Insufficient documentation

## 2024-09-25 DIAGNOSIS — Z886 Allergy status to analgesic agent status: Secondary | ICD-10-CM | POA: Insufficient documentation

## 2024-09-25 DIAGNOSIS — E538 Deficiency of other specified B group vitamins: Secondary | ICD-10-CM | POA: Diagnosis not present

## 2024-09-25 DIAGNOSIS — Z8249 Family history of ischemic heart disease and other diseases of the circulatory system: Secondary | ICD-10-CM | POA: Insufficient documentation

## 2024-09-25 DIAGNOSIS — E039 Hypothyroidism, unspecified: Secondary | ICD-10-CM | POA: Diagnosis not present

## 2024-09-25 DIAGNOSIS — N400 Enlarged prostate without lower urinary tract symptoms: Secondary | ICD-10-CM | POA: Insufficient documentation

## 2024-09-25 DIAGNOSIS — Z91013 Allergy to seafood: Secondary | ICD-10-CM | POA: Diagnosis not present

## 2024-09-25 DIAGNOSIS — Z79899 Other long term (current) drug therapy: Secondary | ICD-10-CM | POA: Insufficient documentation

## 2024-09-25 DIAGNOSIS — Z79890 Hormone replacement therapy (postmenopausal): Secondary | ICD-10-CM | POA: Diagnosis not present

## 2024-09-25 DIAGNOSIS — D539 Nutritional anemia, unspecified: Secondary | ICD-10-CM

## 2024-09-25 DIAGNOSIS — I340 Nonrheumatic mitral (valve) insufficiency: Secondary | ICD-10-CM | POA: Diagnosis not present

## 2024-09-25 LAB — CMP (CANCER CENTER ONLY)
ALT: 13 U/L (ref 0–44)
AST: 15 U/L (ref 15–41)
Albumin: 4 g/dL (ref 3.5–5.0)
Alkaline Phosphatase: 94 U/L (ref 38–126)
Anion gap: 4 — ABNORMAL LOW (ref 5–15)
BUN: 14 mg/dL (ref 8–23)
CO2: 31 mmol/L (ref 22–32)
Calcium: 9.9 mg/dL (ref 8.9–10.3)
Chloride: 103 mmol/L (ref 98–111)
Creatinine: 1.07 mg/dL (ref 0.61–1.24)
GFR, Estimated: >60 mL/min (ref >60)
Glucose, Bld: 81 mg/dL (ref 70–99)
Potassium: 4.2 mmol/L (ref 3.5–5.1)
Sodium: 138 mmol/L (ref 135–145)
Total Bilirubin: 0.3 mg/dL (ref 0.0–1.2)
Total Protein: 6.9 g/dL (ref 6.5–8.1)

## 2024-09-25 LAB — CBC WITH DIFFERENTIAL (CANCER CENTER ONLY)
Abs Immature Granulocytes: 0.02 K/uL (ref 0.00–0.07)
Basophils Absolute: 0.1 K/uL (ref 0.0–0.1)
Basophils Relative: 1 %
Eosinophils Absolute: 0.1 K/uL (ref 0.0–0.5)
Eosinophils Relative: 2 %
HCT: 36.8 % — ABNORMAL LOW (ref 39.0–52.0)
Hemoglobin: 12.4 g/dL — ABNORMAL LOW (ref 13.0–17.0)
Immature Granulocytes: 0 %
Lymphocytes Relative: 11 %
Lymphs Abs: 1 K/uL (ref 0.7–4.0)
MCH: 32.5 pg (ref 26.0–34.0)
MCHC: 33.7 g/dL (ref 30.0–36.0)
MCV: 96.3 fL (ref 80.0–100.0)
Monocytes Absolute: 0.9 K/uL (ref 0.1–1.0)
Monocytes Relative: 10 %
Neutro Abs: 6.6 K/uL (ref 1.7–7.7)
Neutrophils Relative %: 76 %
Platelet Count: 204 K/uL (ref 150–400)
RBC: 3.82 MIL/uL — ABNORMAL LOW (ref 4.22–5.81)
RDW: 13.6 % (ref 11.5–15.5)
WBC Count: 8.7 K/uL (ref 4.0–10.5)
nRBC: 0 % (ref 0.0–0.2)

## 2024-09-25 LAB — IRON AND IRON BINDING CAPACITY (CC-WL,HP ONLY)
Iron: 64 ug/dL (ref 45–182)
Saturation Ratios: 18 % (ref 17.9–39.5)
TIBC: 347 ug/dL (ref 250–450)
UIBC: 283 ug/dL (ref 117–376)

## 2024-09-25 LAB — VITAMIN B12: Vitamin B-12: 171 pg/mL — ABNORMAL LOW (ref 180–914)

## 2024-09-25 LAB — TSH: TSH: 2.78 u[IU]/mL (ref 0.350–4.500)

## 2024-09-25 LAB — FERRITIN: Ferritin: 30 ng/mL (ref 24–336)

## 2024-09-25 LAB — FOLATE: Folate: 6.6 ng/mL (ref >5.9)

## 2024-09-25 NOTE — Progress Notes (Signed)
 [facility] [phone]   [phone]  CONSULT NOTE  REFERRING PHYSICIAN: Therisa Holm, PA  REASON FOR CONSULTATION:  73 years old white male with persistent anemia  HPI Terry Miranda is a 73 y.o. male.   HPI  Discussed the use of AI scribe software for clinical note transcription with the patient, who gave verbal consent to proceed.  History of Present Illness Terry Miranda is a 73 year old male with anemia who presents for evaluation of his condition. He is accompanied by his wife, [place] .  Anemia was initially discovered during pre-operative blood work for a heart procedure in May, with a critically low hemoglobin level of 6.7 g/dL. This prompted a hospital admission where he received a blood transfusion. His hemoglobin was measured at 8.3 g/dL the next day. Subsequent testing in August showed improvement with a hemoglobin level of 11.5 g/dL, though still below normal. He was started on prescription iron  supplements, initially two types, now reduced to one, and was noted to have low B12 levels, for which he received vitamin injections during hospitalization.  His past medical history includes mitral valve regurgitation, an enlarged prostate, and a history of hyperthyroidism treated with radioactive iodine, resulting in hypothyroidism. He also has arthritis.  Family history is significant for prostate cancer in his father, thyroid  cancer in a brother, and a recent heart attack in a sister. There is no family history of blood disorders.  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 He has smoked three-quarters of a pack of cigarettes daily since age 38 and drinks alcohol very occasionally. He has one adopted child.  No current symptoms such as dizziness, fatigue, shortness of breath, nausea, vomiting, diarrhea, headaches, or unexpected weight changes. He feels fine currently.     Past Medical History:  Diagnosis Date    Arthritis    Atypical nevus 02/11/2008   Left Lower Deltoid-Slight   Atypical nevus 08/26/2003   Upper Mid Right Back Sup-Slight to Moderate (w/s), Upper Mid Right Back Inf(Slight), Mid Back-Slight to Moderate(w/s), Mid Right Back Sup-Slight to Moderate(w/s), and Mid Right Back Inf-Slight to Moderate(w/s)   Thyroid  disease       Past Surgical History:  Procedure Laterality Date   COLONOSCOPY     HERNIA REPAIR      Family History  Problem Relation Age of Onset   Thyroid  cancer Brother    Colon cancer Neg Hx    Diabetes Neg Hx     Social History Social History   Tobacco Use   Smoking status: Every Day    Current packs/day: 1.00    Types: Cigarettes   Smokeless tobacco: Never   Tobacco comments:    3/4 PPD  Vaping Use   Vaping status: Never Used  Substance Use Topics   Alcohol use: Yes    Comment: rare   Drug use: No    Allergies  Allergen Reactions   Amoxicillin Swelling    face swelled   Fish Oil Swelling    Face swelling  fish oils   Ibuprofen Swelling    Face swell   Saw Palmetto Swelling    Face swelling  saw palmetto extract    Current Outpatient Medications  Medication Sig Dispense Refill   acetaminophen  (TYLENOL ) 500 MG tablet Take 1,000 mg by mouth every 6 (six) hours as needed for moderate pain (pain score 4-6).     albuterol (VENTOLIN HFA) 108 (90 Base) MCG/ACT inhaler Inhale 2  puffs into the lungs every 4 (four) hours as needed for wheezing or shortness of breath.     atorvastatin  (LIPITOR) 40 MG tablet Take 1 tablet (40 mg total) by mouth daily. 90 tablet 2   benzonatate (TESSALON) 200 MG capsule Take 200 mg by mouth 3 (three) times daily as needed. (Patient not taking: Reported on 09/23/2024)     cyanocobalamin  1000 MCG tablet Take 1 tablet (1,000 mcg total) by mouth daily. 30 tablet 0   ferrous gluconate  (FERGON) 324 MG tablet Take 1 tablet (324 mg total) by mouth 2 (two) times daily with a meal. 60 tablet 0   ibuprofen (ADVIL) 200 MG  tablet Take 400 mg by mouth every 6 (six) hours as needed for mild pain (pain score 1-3) or moderate pain (pain score 4-6).     ketorolac (ACULAR) 0.5 % ophthalmic solution 1 drop as directed.     levothyroxine  (SYNTHROID ) 100 MCG tablet Take 1 tablet (100 mcg total) by mouth daily. 90 tablet 3   prednisoLONE acetate (PRED FORTE) 1 % ophthalmic suspension Place 1 drop into the left eye 4 (four) times daily.     predniSONE (DELTASONE) 10 MG tablet Take 10 mg by mouth as directed. (Patient not taking: Reported on 09/23/2024)     sildenafil (VIAGRA) 100 MG tablet Take 100 mg by mouth daily.     tamsulosin  (FLOMAX ) 0.4 MG CAPS capsule TAKE 1 CAPSULE BY MOUTH EVERY DAY 90 capsule 3   VITAMIN D PO Take 1 capsule by mouth daily.     No current facility-administered medications for this visit.    Review of Systems  Constitutional: positive for fatigue Eyes: negative Ears, nose, mouth, throat, and face: negative Respiratory: negative Cardiovascular: negative Gastrointestinal: negative Genitourinary:negative Integument/breast: negative Hematologic/lymphatic: negative Musculoskeletal:negative Neurological: negative Behavioral/Psych: negative Endocrine: negative Allergic/Immunologic: negative  Physical Exam  MJO:jozmu, healthy, no distress, well nourished, and well developed SKIN: skin color, texture, turgor are normal, no rashes or significant lesions HEAD: Normocephalic, No masses, lesions, tenderness or abnormalities EYES: normal, PERRLA, Conjunctiva are pink and non-injected EARS: External ears normal, Canals clear OROPHARYNX:no exudate, no erythema, and lips, buccal mucosa, and tongue normal  NECK: supple, no adenopathy, no JVD LYMPH:  no palpable lymphadenopathy, no hepatosplenomegaly LUNGS: clear to auscultation , and palpation HEART: regular rate & rhythm, no murmurs, and no gallops ABDOMEN:abdomen soft, non-tender, normal bowel sounds, and no masses or organomegaly BACK: Back  symmetric, no curvature., No CVA tenderness EXTREMITIES:no joint deformities, effusion, or inflammation, no edema  NEURO: alert & oriented x 3 with fluent speech, no focal motor/sensory deficits  PERFORMANCE STATUS: ECOG 0  LABORATORY DATA: Lab Results  Component Value Date   WBC 10.4 08/09/2024   HGB 11.5 (L) 08/09/2024   HCT 33.3 (L) 08/09/2024   MCV 91.2 08/09/2024   PLT 220 08/09/2024      Chemistry      Component Value Date/Time   NA 131 (L) 04/25/2024 0542   NA 131 (L) 04/23/2024 1600   K 3.9 04/25/2024 0542   CL 99 04/25/2024 0542   CO2 25 04/25/2024 0542   BUN 15 04/25/2024 0542   BUN 16 04/23/2024 1600   CREATININE 0.84 04/25/2024 0542      Component Value Date/Time   CALCIUM  8.7 (L) 04/25/2024 0542   ALKPHOS 78 04/25/2024 0542   AST 14 (L) 04/25/2024 0542   ALT 14 04/25/2024 0542   BILITOT 0.5 04/25/2024 0542       RADIOGRAPHIC STUDIES: ECHOCARDIOGRAM COMPLETE Result  Date: 09/11/2024    ECHOCARDIOGRAM REPORT   Patient Name:   Terry Miranda  Date of Exam: 09/11/2024 Medical Rec #:  [id number]     Height:       70.0 in Accession #:    [id number]    Weight:       186.0 lb Date of Birth:  Apr 26, 1951    BSA:          2.024 m Patient Age:    72 years      BP:           134/75 mmHg Patient Gender: M             HR:           49 bpm. Exam Location:  [address] Procedure: 2D Echo, 3D Echo, Cardiac Doppler and Color Doppler (Both Spectral            and Color Flow Doppler were utilized during procedure). Indications:    I34.0 Mitral Valve Insufficiency (Mitral Valve Prolapse)  History:        Patient has prior history of Echocardiogram examinations, most                 recent 06/20/2023. Mitral Valve Prolapse, Mitral Valve Disease and                 Aortic Valve Disease, Arrythmias:Bradycardia,                 Signs/Symptoms:Dyspnea; Risk Factors:Current Smoker and                 Dyslipidemia. Aortic Insufficiency, Severe Mitral Insufficiency.  Sonographer:    Heather Hawks RDCS Referring Phys: JONATHAN J BERRY IMPRESSIONS  1. Left ventricular ejection fraction, by estimation, is 55 to 60%. Left ventricular ejection fraction by 3D volume is 62 %. The left ventricle has normal function. The left ventricle has no regional wall motion abnormalities. The left ventricular internal cavity size was moderately dilated. Left ventricular diastolic parameters are consistent with Grade III diastolic dysfunction (restrictive). Elevated left atrial pressure. The E/e' is 18.  2. Right ventricular systolic function is normal. The right ventricular size is normal. There is moderately elevated pulmonary artery systolic pressure. The estimated right ventricular systolic pressure is 48.4 mmHg.  3. Left atrial size was severely dilated.  4. Right atrial size was mildly dilated.  5. The mitral valve is degenerative. Severe mitral valve regurgitation. No evidence of mitral stenosis. There is prolapse of both leaflets (posterior leafleat > anterior leaflet) flail scallop cannot be ruled out of the mitral valve.  6. The aortic valve is tricuspid. Aortic valve regurgitation is mild. Aortic valve sclerosis is present, with no evidence of aortic valve stenosis.  7. Pulmonic valve regurgitation is moderate.  8. The inferior vena cava is dilated in size with >50% respiratory variability, suggesting right atrial pressure of 8 mmHg. Comparison(s): A prior study was performed on 06/20/2023. LVEF remains preserved, but diastolic dysfunction/filling pressures/RAP/RVSP have worsened. Consider TEE to further evaluate the mitral apparatus and MR, clinical correlation required. FINDINGS  Left Ventricle: Left ventricular ejection fraction, by estimation, is 55 to 60%. Left ventricular ejection fraction by 3D volume is 62 %. The left ventricle has normal function. The left ventricle has no regional wall motion abnormalities. The left ventricular internal cavity size was moderately dilated. There is no left  ventricular hypertrophy. Left ventricular diastolic parameters are consistent with Grade III diastolic dysfunction (restrictive). Elevated left atrial pressure.  The E/e' is 54. Right Ventricle: The right ventricular size is normal. No increase in right ventricular wall thickness. Right ventricular systolic function is normal. There is moderately elevated pulmonary artery systolic pressure. The tricuspid regurgitant velocity is 3.18 m/s, and with an assumed right atrial pressure of 8 mmHg, the estimated right ventricular systolic pressure is 48.4 mmHg. Left Atrium: Left atrial size was severely dilated. Right Atrium: Right atrial size was mildly dilated. Pericardium: There is no evidence of pericardial effusion. Mitral Valve: The mitral valve is degenerative in appearance. There is prolapse of both leaflets (posterior leafleat > anterior leaflet) flail scallop cannot be ruled out of the mitral valve. Severe mitral valve regurgitation, with anteriorly-directed jet. No evidence of mitral valve stenosis. MV peak gradient, 10.5 mmHg. The mean mitral valve gradient is 3.0 mmHg. Tricuspid Valve: The tricuspid valve is normal in structure. Tricuspid valve regurgitation is mild . No evidence of tricuspid stenosis. Aortic Valve: The aortic valve is tricuspid. Aortic valve regurgitation is mild. Aortic regurgitation PHT measures 511 msec. Aortic valve sclerosis is present, with no evidence of aortic valve stenosis. Aortic valve mean gradient measures 6.0 mmHg. Aortic valve peak gradient measures 11.3 mmHg. Aortic valve area, by VTI measures 1.99 cm. Pulmonic Valve: The pulmonic valve was normal in structure. Pulmonic valve regurgitation is moderate. No evidence of pulmonic stenosis. Aorta: The aortic root and ascending aorta are structurally normal, with no evidence of dilitation. Venous: The inferior vena cava is dilated in size with greater than 50% respiratory variability, suggesting right atrial pressure of 8 mmHg.  IAS/Shunts: There is right bowing of the interatrial septum, suggestive of elevated left atrial pressure. The atrial septum is grossly normal. Additional Comments: 3D was performed not requiring image post processing on an independent workstation and was normal.  LEFT VENTRICLE PLAX 2D LVIDd:         6.50 cm         Diastology LVIDs:         4.40 cm         LV e' medial:    6.80 cm/s LV PW:         0.90 cm         LV E/e' medial:  22.6 LV IVS:        0.75 cm         LV e' lateral:   11.80 cm/s LVOT diam:     1.90 cm         LV E/e' lateral: 13.1 LV SV:         84 LV SV Index:   41 LVOT Area:     2.84 cm        3D Volume EF LV IVRT:       69 msec         LV 3D EF:    Left                                             ventricul                                             ar  ejection                                             fraction                                             by 3D                                             volume is                                             62 %.                                 3D Volume EF:                                3D EF:        62 %                                LV EDV:       234 ml                                LV ESV:       89 ml                                LV SV:        145 ml RIGHT VENTRICLE RV Basal diam:  4.10 cm     PULMONARY VEINS RV Mid diam:    3.10 cm     A Reversal Velocity: 39.40 cm/s RV S prime:     15.10 cm/s  Diastolic Velocity:  74.30 cm/s TAPSE (M-mode): 2.8 cm      S/D Velocity:        1.10 RVSP:           48.4 mmHg   Systolic Velocity:   78.80 cm/s LEFT ATRIUM              Index         RIGHT ATRIUM           Index LA diam:        4.90 cm  2.42 cm/m    RA Pressure: 8.00 mmHg LA Vol (A2C):   215.0 ml 106.23 ml/m  RA Area:     22.50 cm LA Vol (A4C):   172.0 ml 84.98 ml/m   RA Volume:   75.60 ml  37.35 ml/m LA Biplane Vol: 195.0 ml 96.35 ml/m  AORTIC VALVE AV Area (Vmax):    2.32 cm AV Area (Vmean):    1.93 cm AV Area (VTI):  1.99 cm AV Vmax:           168.00 cm/s AV Vmean:          112.000 cm/s AV VTI:            0.420 m AV Peak Grad:      11.3 mmHg AV Mean Grad:      6.0 mmHg LVOT Vmax:         137.50 cm/s LVOT Vmean:        76.350 cm/s LVOT VTI:          0.295 m LVOT/AV VTI ratio: 0.70 AI PHT:            511 msec  AORTA Ao Root diam: 3.10 cm Ao Asc diam:  3.80 cm MITRAL VALVE                 TRICUSPID VALVE MV Area (PHT): cm           TR Peak grad:   40.4 mmHg MV Area VTI:   1.50 cm      TR Vmax:        318.00 cm/s MV Peak grad:  10.5 mmHg     Estimated RAP:  8.00 mmHg MV Mean grad:  3.0 mmHg      RVSP:           48.4 mmHg MV Vmax:       1.62 m/s MV Vmean:      75.6 cm/s     SHUNTS MV Decel Time: 208 msec      Systemic VTI:  0.30 m MR Peak grad:   165.9 mmHg   Systemic Diam: 1.90 cm MR Mean grad:   100.0 mmHg MR Vmax:        644.00 cm/s MR Vmean:       473.0 cm/s MR PISA:        14.14 cm MR PISA Radius: 1.50 cm MV E velocity: 154.00 cm/s MV A velocity: 77.85 cm/s MV E/A ratio:  1.98 Sunit Tolia Electronically signed by Madonna Large Signature Date/Time: 09/11/2024/11:23:44 PM    Final     ASSESSMENT AND PLAN: Assessment and Plan Assessment & Plan Anemia due to iron  and vitamin B12 deficiency Chronic anemia with initial hemoglobin of 6.7 g/dL on Apr 24, 2024, improved to 11.5 g/dL by August 09, 2024, after blood transfusion and iron  supplementation. Iron  studies showed ferritin of 3 ng/mL, iron  level of 36 mcg/dL, and iron  saturation of 8%. Vitamin B12 level was 121 pg/mL. No overt signs of gastrointestinal bleeding. Differential diagnosis includes gastrointestinal blood loss, possibly due to an undiagnosed lesion. No history of gastrointestinal surgery. No current symptoms of anemia such as fatigue or shortness of breath. Further investigation needed to determine the cause of anemia. Medical decision making includes the need for gastrointestinal evaluation due to unexplained blood loss, considering  the patient's age and gender, which do not typically predispose to anemia without an identifiable cause. - Order blood work including iron  studies, ferritin, B12, folic acid , and serum protein electrophoresis - Refer to gastroenterology for endoscopy and colonoscopy to evaluate for potential gastrointestinal bleeding - Consider intravenous iron  if oral iron  supplementation is insufficient - Ensure B12 supplementation with cyanocobalamin  - Follow up on lab results and adjust treatment plan as necessary  Tobacco use disorder Chronic tobacco use with smoking three-quarters of a pack per day since age 27. Discussed the risks of continued smoking, including lung cancer, especially given his age and current health status. Encouraged  smoking cessation. - Advise smoking cessation The patient was advised to call immediately if he has any concerning symptoms in the interval. The patient voices understanding of current disease status and treatment options and is in agreement with the current care plan.  All questions were answered. The patient knows to call the clinic with any problems, questions or concerns. We can certainly see the patient much sooner if necessary.  Thank you so much for allowing me to participate in the care of Terry Miranda. I will continue to follow up the patient with you and assist in his care. The total time spent in the appointment was 60 minutes including review of chart and various tests results, discussions about plan of care and coordination of care plan .   Disclaimer: This note was dictated with voice recognition software. Similar sounding words can inadvertently be transcribed and may not be corrected upon review.   Sherrod MARLA Sherrod September 25, 2024, 12:20 PM

## 2024-09-26 ENCOUNTER — Telehealth: Payer: Self-pay

## 2024-09-26 NOTE — Telephone Encounter (Signed)
 Voicemail left to patient advising him to start on an over the counter B12 supplement per Dr. Sherrod.   His vitamin B12 level is low. He will need to start taking over-the-counter vitamin B12 supplement 1000 mcg p.o. 3-4 times a week.

## 2024-09-28 LAB — PROTEIN ELECTROPHORESIS, SERUM, WITH REFLEX
A/G Ratio: 1.3 (ref 0.7–1.7)
Albumin ELP: 3.6 g/dL (ref 2.9–4.4)
Alpha-1-Globulin: 0.3 g/dL (ref 0.0–0.4)
Alpha-2-Globulin: 0.6 g/dL (ref 0.4–1.0)
Beta Globulin: 1.1 g/dL (ref 0.7–1.3)
Gamma Globulin: 0.8 g/dL (ref 0.4–1.8)
Globulin, Total: 2.8 g/dL (ref 2.2–3.9)
Total Protein ELP: 6.4 g/dL (ref 6.0–8.5)

## 2024-10-10 ENCOUNTER — Encounter: Payer: Self-pay | Admitting: Internal Medicine

## 2024-10-11 ENCOUNTER — Other Ambulatory Visit: Payer: Self-pay | Admitting: Physician Assistant

## 2024-10-11 DIAGNOSIS — E538 Deficiency of other specified B group vitamins: Secondary | ICD-10-CM

## 2024-10-22 ENCOUNTER — Encounter: Payer: Self-pay | Admitting: Internal Medicine

## 2024-10-22 ENCOUNTER — Encounter: Payer: Self-pay | Admitting: Physician Assistant

## 2024-10-23 ENCOUNTER — Ambulatory Visit (INDEPENDENT_AMBULATORY_CARE_PROVIDER_SITE_OTHER): Admitting: Otolaryngology

## 2024-11-20 ENCOUNTER — Telehealth: Payer: Self-pay

## 2024-11-20 NOTE — Telephone Encounter (Signed)
 Pt called in reference to his upcoming appt with Dr Sherrod on 11-25-24.  He has an upcoming appt with GI and wants to know if he needs to see Dr Sherrod prior to after appt with GI.

## 2024-11-25 ENCOUNTER — Inpatient Hospital Stay

## 2024-11-25 ENCOUNTER — Inpatient Hospital Stay: Admitting: Internal Medicine

## 2024-12-09 NOTE — Progress Notes (Unsigned)
 "     Terry Console, Terry Miranda [address] [place], [place]  [address] Phone: [phone]   Gastroenterology Consultation  Referring Provider:     Alben Therisa Miranda, Terry Miranda Primary Care Physician:  Terry Miranda, Terry Miranda Primary Gastroenterologist:  Terry Console, Terry Miranda / Terry Holt, Terry Miranda  Reason for Consultation:     Iron  deficiency and B12 deficiency anemia.        HPI:   Discussed the use of AI scribe software for clinical note transcription with the patient, who gave verbal consent to proceed.  Terry Miranda is a 73 year old male referred to evaluate anemia.  He saw Dr. Gatha hematologist 09/25/2024 to evaluate anemia.  04/2024 he had low hemoglobin 6.7.  Admitted to the hospital and received blood transfusion.  Hemoglobin improved to 8.3.  Started on prescription iron  once daily.  Had B12 deficiency treated with B12 injections.  Last labs 09/25/2024 showed improved stable hemoglobin 12.4, hematocrit 36, MCV 96, Normal folate, improved ferritin of 30, low vitamin B12 171, improved normal iron  panel.  He has severe mitral valve regurgitation followed by Dr. Dorn Miranda.  He was undergoing preop evaluation for mitral valve replacement surgery when his anemia was incidentally discovered May 2025.  Last echocardiogram 09/11/2024 showed normal LVEF 55 to 60%.  02/2015 last colonoscopy by Dr. Obie: Normal.  No polyps.  10-year repeat will be due 02/2025.  No previous EGD. History of Present Illness Iron  deficiency anemia - Anemia first identified seven months ago during preoperative blood work for planned heart procedure (mitral valve replacement); hemoglobin measured at 6.3 g/dL. - Cardiac intervention postponed and hospitalization required for blood transfusion. - Most recent laboratory testing on September 25, 2024 showed hemoglobin improved to 12.4 g/dL. - No fatigue, weakness, or weight loss. - Continues to work forty hours per week and manages activities at home.  Gastrointestinal  symptoms - No abdominal pain, blood in stool, constipation, diarrhea, black stools, heartburn, acid reflux, or trouble swallowing. - Rare heartburn occurs only after eating spicy foods. - No previous EGD.  Cardiopulmonary symptoms - No shortness of breath except with very strenuous activity. - No chest pain.  Medication use - Does not take blood thinners or aspirin. - Uses Tylenol  as needed. - Ibuprofen taken occasionally if Tylenol  is unavailable, but not regularly.  PMH: Aortic atherosclerosis, severe mitral valve regurgitation, hypothyroidism, IDA, tobacco use disorder, and arthritis.  09/2024 echocardiogram LVEF 55 to 60%.  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 He has smoked three-quarters of a pack of cigarettes daily since age 6.  Rare alcohol use.  Past Medical History:  Diagnosis Date   Arthritis    Atypical nevus 02/11/2008   Left Lower Deltoid-Slight   Atypical nevus 08/26/2003   Upper Mid Right Back Sup-Slight to Moderate (w/s), Upper Mid Right Back Inf(Slight), Mid Back-Slight to Moderate(w/s), Mid Right Back Sup-Slight to Moderate(w/s), and Mid Right Back Inf-Slight to Moderate(w/s)   Thyroid  disease     Past Surgical History:  Procedure Laterality Date   COLONOSCOPY     HERNIA REPAIR      Prior to Admission medications  Medication Sig Start Date End Date Taking? Authorizing Provider  acetaminophen  (TYLENOL ) 500 MG tablet Take 1,000 mg by mouth every 6 (six) hours as needed for moderate pain (pain score 4-6).   Yes Provider, Historical, Terry Miranda  atorvastatin  (LIPITOR) 40 MG tablet Take 1 tablet (40 mg total) by mouth daily. 11/19/21 12/10/24  Yes Terry Terry PARAS, Terry Miranda  cyanocobalamin  1000 MCG tablet Take 1 tablet (1,000 mcg total) by mouth daily. 04/26/24  Yes Terry Elsie BROCKS, Terry Miranda  ferrous gluconate  [facility]) 324 MG tablet Take 1 tablet (324 mg total) by mouth 2 (two) times daily with a meal. 04/25/24  Yes Terry Elsie BROCKS, Terry Miranda  ibuprofen (ADVIL) 200 MG tablet Take 400 mg by mouth every 6 (six) hours as needed for mild pain (pain score 1-3) or moderate pain (pain score 4-6).   Yes Provider, Historical, Terry Miranda  levothyroxine  (SYNTHROID ) 100 MCG tablet Take 1 tablet (100 mcg total) by mouth daily. 07/09/24  Yes Terry Miranda, [place], Terry Miranda  Na Sulfate-K Sulfate-Mg Sulfate concentrate (SUPREP) 17.5-3.13-1.6 GM/177ML SOLN Take 1 kit (354 mLs total) by mouth once for 1 dose. 12/10/24 12/10/24 Yes Terry Miranda, Terry Miranda  prednisoLONE acetate (PRED FORTE) 1 % ophthalmic suspension Place 1 drop into the left eye 4 (four) times daily.   Yes Provider, Historical, Terry Miranda  sildenafil (VIAGRA) 100 MG tablet Take 100 mg by mouth daily. 07/01/24  Yes Provider, Historical, Terry Miranda  tamsulosin  (FLOMAX ) 0.4 MG CAPS capsule TAKE 1 CAPSULE BY MOUTH EVERY DAY 12/19/19  Yes Terry Miranda, Terry CROME, Terry Miranda  VITAMIN D PO Take 1 capsule by mouth daily.   Yes Provider, Historical, Terry Miranda  albuterol (VENTOLIN HFA) 108 (90 Base) MCG/ACT inhaler Inhale 2 puffs into the lungs every 4 (four) hours as needed for wheezing or shortness of breath. Patient not taking: Reported on 12/10/2024    Provider, Historical, Terry Miranda  benzonatate (TESSALON) 200 MG capsule Take 200 mg by mouth 3 (three) times daily as needed. Patient not taking: Reported on 12/10/2024    Provider, Historical, Terry Miranda  ketorolac (ACULAR) 0.5 % ophthalmic solution 1 drop as directed. Patient not taking: Reported on 12/10/2024    Provider, Historical, Terry Miranda  predniSONE (DELTASONE) 10 MG tablet Take 10 mg by mouth as directed. Patient not taking: Reported on 12/10/2024 06/26/24   Provider, Historical, Terry Miranda     Family History  Problem Relation Age of Onset   Thyroid  cancer Brother    Colon cancer Neg Hx    Diabetes Neg Hx      Social History[1]  Allergies as of 12/10/2024 - Review Complete 12/10/2024  Allergen Reaction Noted   Amoxicillin Swelling 09/14/2009   Fish oil Swelling 03/24/2021   Ibuprofen Swelling 02/09/2015    Saw palmetto Swelling 03/24/2021    Review of Systems:    All systems reviewed and negative except where noted in HPI.   Physical Exam:  BP 110/64   Pulse (!) 56   Ht 5' 10 (1.778 m)   Wt 191 lb (86.6 kg)   BMI 27.41 kg/m  No LMP for male patient.  General:   Alert,  Well-developed, well-nourished, pleasant and cooperative in NAD Lungs:  Respirations even and unlabored.  Clear throughout to auscultation.   No wheezes, crackles, or rhonchi. No acute distress. Heart:  Regular rate and rhythm.  Loud pansystolic heart murmur auscultated throughout.  No clicks, rubs, or gallops. Abdomen:  Normal bowel sounds.  No bruits.  Soft, and non-distended without masses, hepatosplenomegaly or hernias noted.  No Tenderness.  No guarding or rebound tenderness.    Neurologic:  Alert and oriented x3;  grossly normal neurologically. Psych:  Alert and cooperative. Normal mood and affect.   Imaging Studies: No results found.  Labs: CBC    Component Value Date/Time   WBC 8.7 09/25/2024 1307   WBC 10.4 08/09/2024 2058   RBC 3.82 (  L) 09/25/2024 1307   HGB 12.4 (L) 09/25/2024 1307   HGB 6.2 (LL) 04/23/2024 1600   HCT 36.8 (L) 09/25/2024 1307   HCT 21.6 (L) 04/23/2024 1600   PLT 204 09/25/2024 1307   PLT 260 04/23/2024 1600   MCV 96.3 09/25/2024 1307   MCV 72 (L) 04/23/2024 1600    CMP     Component Value Date/Time   NA 138 09/25/2024 1307   NA 131 (L) 04/23/2024 1600   K 4.2 09/25/2024 1307   CL 103 09/25/2024 1307   CO2 31 09/25/2024 1307   GLUCOSE 81 09/25/2024 1307   BUN 14 09/25/2024 1307   BUN 16 04/23/2024 1600   CREATININE 1.07 09/25/2024 1307   CALCIUM  9.9 09/25/2024 1307   PROT 6.9 09/25/2024 1307   ALBUMIN 4.0 09/25/2024 1307   AST 15 09/25/2024 1307   ALT 13 09/25/2024 1307   ALKPHOS 94 09/25/2024 1307   BILITOT 0.3 09/25/2024 1307   GFRNONAA >60 09/25/2024 1307    Assessment and Plan:   NASSER KU is a 73 y.o. y/o male has been referred for:   1.  Iron   deficiency anemia 2.  B12 deficiency anemia 3.  Severe Mitral Valve Regurgitation;  Normal LVEF 55-60%.  Plan: - Labs: CBC, iron  panel, ferritin, B12 - Scheduling EGD & Colonoscopy to evaluate GI sources of iron  and B12 deficiency anemia.  Evaluate for GI blood loss. I discussed risks of EGD and colonoscopy with patient to include risk of bleeding, perforation, and risk of sedation.  Patient expressed understanding and agrees to proceed with procedures.   - If EGD and colonoscopy are unrevealing, then schedule capsule endoscopy. - Continue iron  and B12 supplements. - Continue follow-up with PCP and hematology. - Avoid NSAIDs - Request cardiac clearance for EGD and colonoscopy with sedation in [facility] given his history of severe mitral valve regurgitation.  Normal LVEF.  Follow up after EGD / Colon with TG for Anemia.  Terry Console, Terry Miranda       [1]  Social History Tobacco Use   Smoking status: Every Day    Current packs/day: 1.00    Types: Cigarettes   Smokeless tobacco: Never   Tobacco comments:    3/4 PPD  Vaping Use   Vaping status: Never Used  Substance Use Topics   Alcohol use: Yes    Comment: rare   Drug use: No   "

## 2024-12-10 ENCOUNTER — Other Ambulatory Visit (INDEPENDENT_AMBULATORY_CARE_PROVIDER_SITE_OTHER)

## 2024-12-10 ENCOUNTER — Ambulatory Visit: Admitting: Physician Assistant

## 2024-12-10 ENCOUNTER — Encounter: Payer: Self-pay | Admitting: Physician Assistant

## 2024-12-10 VITALS — BP 110/64 | HR 56 | Ht 70.0 in | Wt 191.0 lb

## 2024-12-10 DIAGNOSIS — D509 Iron deficiency anemia, unspecified: Secondary | ICD-10-CM | POA: Diagnosis not present

## 2024-12-10 DIAGNOSIS — E538 Deficiency of other specified B group vitamins: Secondary | ICD-10-CM

## 2024-12-10 LAB — CBC WITH DIFFERENTIAL/PLATELET
Basophils Absolute: 0.1 K/uL (ref 0.0–0.1)
Basophils Relative: 1.3 % (ref 0.0–3.0)
Eosinophils Absolute: 0.1 K/uL (ref 0.0–0.7)
Eosinophils Relative: 1.9 % (ref 0.0–5.0)
HCT: 35.6 % — ABNORMAL LOW (ref 39.0–52.0)
Hemoglobin: 12.2 g/dL — ABNORMAL LOW (ref 13.0–17.0)
Lymphocytes Relative: 15.6 % (ref 12.0–46.0)
Lymphs Abs: 1 K/uL (ref 0.7–4.0)
MCHC: 34.3 g/dL (ref 30.0–36.0)
MCV: 95 fl (ref 78.0–100.0)
Monocytes Absolute: 0.8 K/uL (ref 0.1–1.0)
Monocytes Relative: 11.6 % (ref 3.0–12.0)
Neutro Abs: 4.6 K/uL (ref 1.4–7.7)
Neutrophils Relative %: 69.6 % (ref 43.0–77.0)
Platelets: 180 K/uL (ref 150.0–400.0)
RBC: 3.75 Mil/uL — ABNORMAL LOW (ref 4.22–5.81)
RDW: 14.4 % (ref 11.5–15.5)
WBC: 6.6 K/uL (ref 4.0–10.5)

## 2024-12-10 MED ORDER — NA SULFATE-K SULFATE-MG SULF 17.5-3.13-1.6 GM/177ML PO SOLN: 1.0000 | Freq: Once | ORAL | 0 refills | Status: AC

## 2024-12-10 NOTE — Patient Instructions (Signed)
 Your provider has requested that you go to the basement level for lab work before leaving today. Press B on the elevator. The lab is located at the first door on the left as you exit the elevator.  You have been scheduled for an Endoscopy and Colonoscopy. Please follow the written instructions given to you at your visit today.  If you use inhalers (even only as needed), please bring them with you on the day of your procedure.  DO NOT TAKE 7 DAYS PRIOR TO TEST- Trulicity (dulaglutide) Ozempic, Wegovy (semaglutide) Mounjaro (tirzepatide) Bydureon Bcise (exanatide extended release)  DO NOT TAKE 1 DAY PRIOR TO YOUR TEST Rybelsus (semaglutide) Adlyxin (lixisenatide) Victoza (liraglutide) Byetta (exanatide) ___________________________________________________________________________  Please follow up sooner if symptoms increase or worsen __________________________________________________________________________  Due to recent changes in healthcare laws, you may see the results of your imaging and laboratory studies on MyChart before your provider has had a chance to review them.  We understand that in some cases there may be results that are confusing or concerning to you. Not all laboratory results come back in the same time frame and the provider may be waiting for multiple results in order to interpret others.  Please give us  48 hours in order for your provider to thoroughly review all the results before contacting the office for clarification of your results.   Thank you for trusting me with your gastrointestinal care!   Ellouise Console, PA-C _______________________________________________________  If your blood pressure at your visit was 140/90 or greater, please contact your primary care physician to follow up on this.  _______________________________________________________  If you are age 43 or older, your body mass index should be between 23-30. Your Body mass index is 27.41 kg/m.  If this is out of the aforementioned range listed, please consider follow up with your Primary Care Provider.  If you are age 87 or younger, your body mass index should be between 19-25. Your Body mass index is 27.41 kg/m. If this is out of the aformentioned range listed, please consider follow up with your Primary Care Provider.   ________________________________________________________  The [facility] providers would like to encourage you to use MYCHART to communicate with providers for non-urgent requests or questions.  Due to long hold times on the telephone, sending your provider a message by [facility] may be a faster and more efficient way to get a response.  Please allow 48 business hours for a response.  Please remember that this is for non-urgent requests.  _______________________________________________________

## 2024-12-11 LAB — IRON,TIBC AND FERRITIN PANEL
%SAT: 12 % — ABNORMAL LOW (ref 20–48)
Ferritin: 18 ng/mL — ABNORMAL LOW (ref 24–380)
Iron: 38 ug/dL — ABNORMAL LOW (ref 50–180)
TIBC: 307 ug/dL (ref 250–425)

## 2024-12-11 LAB — VITAMIN B12: Vitamin B-12: 149 pg/mL — ABNORMAL LOW (ref 211–911)

## 2024-12-13 NOTE — Progress Notes (Signed)
 Agree with the assessment and plan as outlined by Ellouise Console, PA-C.  Patient should be appropriate for [facility] assuming cardiology has no concerns.

## 2024-12-16 ENCOUNTER — Ambulatory Visit: Payer: Self-pay | Admitting: Physician Assistant

## 2024-12-16 ENCOUNTER — Telehealth: Payer: Self-pay

## 2024-12-16 NOTE — Telephone Encounter (Signed)
 [facility] Pre-operative Risk Assessment     Request for surgical clearance:     Endoscopy Procedure  What type of surgery is being performed?     Colonoscopy and Endoscopy  When is this surgery scheduled?     01/15/25  What type of clearance is required ?   Cardiac  Are there any medications that need to be held prior to surgery and how long? N/A  Practice name and name of physician performing surgery?      [facility]  What is your office phone and fax number?      Phone- [phone]  Fax- [phone]  Anesthesia type (None, local, MAC, general) ?       MAC   Please route your response to Alethea Blocker, CMA

## 2024-12-16 NOTE — Telephone Encounter (Signed)
" ° °  Name: NOLYN SWAB  DOB: November 03, 1951  MRN: [id number]  Primary Cardiologist: Dorn Lesches, MD  Chart reviewed as part of pre-operative protocol coverage. The patient has an upcoming visit scheduled with Dr. Lesches on 12/25/24 at which time clearance can be addressed in case there are any issues that would impact surgical recommendations.  Colonoscopy/endoscopy is not scheduled until 01/15/25 as below. I added preop FYI to appointment note so that provider is aware to address at time of outpatient visit.  Per office protocol the cardiology provider should forward their finalized clearance decision and recommendations regarding antiplatelet therapy to the requesting party below.    I will route this message as FYI to requesting party and remove this message from the preop box as separate preop APP input not needed at this time.   Please call with any questions.  Erlin Gardella D Darryon Bastin, NP  12/16/2024, 5:03 PM   "

## 2024-12-25 ENCOUNTER — Ambulatory Visit: Admitting: Cardiovascular Disease

## 2024-12-27 NOTE — Telephone Encounter (Signed)
 Patient cancelled the cardiology appointment with Dr. Court on 1/14.  I left him a message to call me back to discuss if he plans to reschedule and let him know that if we do not have clearance, we will need to cancel his upcoming procedures until we have it.   Left message for patient to call back

## 2024-12-30 NOTE — Telephone Encounter (Signed)
 Pt scheduled with Dr. Court on 01/07/2025.

## 2024-12-30 NOTE — Telephone Encounter (Signed)
 I spoke with the patient this am.  He cancelled the appointment with Dr. Court because he thought he needed to have his [facility] procedures first.  I explained that we need clearance from Dr. Court to proceed with the procedures and if he can't get back in before his scheduled procedures, we will need to reschedule.  Patient states he will contact cardiology today and reschedule his appointment with Dr. Court.  He will message me back through MyChart or call the office once he has rescheduled his procedures.

## 2025-01-07 ENCOUNTER — Encounter: Payer: Self-pay | Admitting: Cardiovascular Disease

## 2025-01-07 ENCOUNTER — Encounter: Payer: Self-pay | Admitting: Gastroenterology

## 2025-01-07 ENCOUNTER — Ambulatory Visit: Attending: Cardiovascular Disease | Admitting: Cardiovascular Disease

## 2025-01-07 VITALS — BP 132/80 | HR 51 | Ht 70.0 in | Wt 191.0 lb

## 2025-01-07 DIAGNOSIS — D649 Anemia, unspecified: Secondary | ICD-10-CM | POA: Diagnosis not present

## 2025-01-07 DIAGNOSIS — I340 Nonrheumatic mitral (valve) insufficiency: Secondary | ICD-10-CM

## 2025-01-07 DIAGNOSIS — I700 Atherosclerosis of aorta: Secondary | ICD-10-CM | POA: Diagnosis not present

## 2025-01-07 DIAGNOSIS — R931 Abnormal findings on diagnostic imaging of heart and coronary circulation: Secondary | ICD-10-CM | POA: Diagnosis not present

## 2025-01-07 DIAGNOSIS — Z720 Tobacco use: Secondary | ICD-10-CM | POA: Diagnosis not present

## 2025-01-07 NOTE — Patient Instructions (Signed)
 Medication Instructions:  Your physician recommends that you continue on your current medications as directed. Please refer to the Current Medication list given to you today.  *If you need a refill on your cardiac medications before your next appointment, please call your pharmacy*  Follow-Up: At [facility], you and your health needs are our priority.  As part of our continuing mission to provide you with exceptional heart care, our providers are all part of one team.  This team includes your primary Cardiologist (physician) and Advanced Practice Providers or APPs (Physician Assistants and Nurse Practitioners) who all work together to provide you with the care you need, when you need it.  Your next appointment:   6 week(s)  Provider:   Dorn Lesches, MD    We recommend signing up for the patient portal called MyChart.  Sign up information is provided on this After Visit Summary.  MyChart is used to connect with patients for Virtual Visits (Telemedicine).  Patients are able to view lab/test results, encounter notes, upcoming appointments, etc.  Non-urgent messages can be sent to your provider as well.   To learn more about what you can do with MyChart, go to [email or web address].   Other Instructions

## 2025-01-07 NOTE — Progress Notes (Unsigned)
 "     01/07/2025 Terry Miranda   01-15-1951  [id number]  Primary Physician Alben Therisa MATSU, PA Primary Cardiologist: Dorn JINNY Lesches MD [place] [place], [place]  HPI:  Terry Miranda is a 74 y.o.    thin appearing married Caucasian male father of 1 child with one grandchild on the way whose wife Georgia  is also a patient of mine.   He was referred by his primary care provider, Therisa Alben PA-C, for evaluation of asymptomatic bradycardia.  I last saw him in the office 04/17/2024.  He was found to have a slow heart rate by his occupational nurse at work. Does have a heart rate in the 50s. Has been told that he has a cardiac murmur. His risk factors include 50 pack years of tobacco abuse currently smoking 1 pack/day recalcitrant to risk factor modification. He has no other cardiac risk factors. Is never had a heart attack or stroke. He denies chest pain or shortness of breath. He does walk at work but does do organized exercise otherwise.   He had developed progressive dyspnea on exertion over the last 3 months.  His echo performed a year ago did show normal LV systolic function, mild LV dilatation with severe MR and severe left atrial enlargement.  I think at this time he would benefit from mitral valve repair if possible.  I am referring him to the hospital for TEE/right left heart cath after which we will decide whether or not to refer him out for minimally invasive mitral valve repair.   Preprocedure labs prior to his TEE, right left heart cath revealed a hemoglobin in the 6 range.  There is no evidence of blood loss anemia.  His B12, iron  and iron  binding capacity were reduced.  He was transfused 2 units of red blood cells and placed on iron  repletion and B12.  His symptoms have resolved.  He was placed on replacement iron  and B12.  His hemoglobin has remained stable in the low 12 range.  He continues to be asymptomatic  Since I saw him 3 months ago his remained stable.  His hemoglobin is above 12.  He  is scheduled for EGD and colonoscopy tomorrow.  After hopefully determining the etiology of his severe anemia we will then proceed with TEE, right and left heart cath in anticipation of mitral valve repair given severe dilatation of his LV.  Active Medications[1]   Allergies[2]  Social History   Socioeconomic History   Marital status: Married    Spouse name: Not on file   Number of children: Not on file   Years of education: Not on file   Highest education level: Not on file  Occupational History   Not on file  Tobacco Use   Smoking status: Every Day    Current packs/day: 1.00    Types: Cigarettes   Smokeless tobacco: Never   Tobacco comments:    3/4 PPD  Vaping Use   Vaping status: Never Used  Substance and Sexual Activity   Alcohol use: Yes    Comment: rare   Drug use: No   Sexual activity: Not Currently  Other Topics Concern   Not on file  Social History Narrative   Not on file   Social Drivers of Health   Tobacco Use: High Risk (01/07/2025)   Patient History    Smoking Tobacco Use: Every Day    Smokeless Tobacco Use: Never    Passive Exposure: Not on file  Financial Resource Strain:  Not on file  Food Insecurity: No Food Insecurity (09/25/2024)   Epic    Worried About Programme Researcher, Broadcasting/film/video in the Last Year: Never true    Ran Out of Food in the Last Year: Never true  Transportation Needs: No Transportation Needs (09/25/2024)   Epic    Lack of Transportation (Medical): No    Lack of Transportation (Non-Medical): No  Physical Activity: Not on file  Stress: Not on file  Social Connections: Unknown (04/24/2024)   Social Connection and Isolation Panel    Frequency of Communication with Friends and Family: Patient declined    Frequency of Social Gatherings with Friends and Family: Patient declined    Attends Religious Services: Not on Insurance Claims Handler of Clubs or Organizations: Not on file    Attends Banker Meetings: Patient declined    Marital  Status: Married  Intimate Partner Violence: Not At Risk (09/25/2024)   Epic    Fear of Current or Ex-Partner: No    Emotionally Abused: No    Physically Abused: No    Sexually Abused: No  Depression (PHQ2-9): Low Risk (09/25/2024)   Depression (PHQ2-9)    PHQ-2 Score: 0  Alcohol Screen: Not on file  Housing: Low Risk (09/25/2024)   Epic    Unable to Pay for Housing in the Last Year: No    Number of Times Moved in the Last Year: 0    Homeless in the Last Year: No  Utilities: Not At Risk (09/25/2024)   Epic    Threatened with loss of utilities: No  Health Literacy: Not on file     Review of Systems: General: negative for chills, fever, night sweats or weight changes.  Cardiovascular: negative for chest pain, dyspnea on exertion, edema, orthopnea, palpitations, paroxysmal nocturnal dyspnea or shortness of breath Dermatological: negative for rash Respiratory: negative for cough or wheezing Urologic: negative for hematuria Abdominal: negative for nausea, vomiting, diarrhea, bright red blood per rectum, melena, or hematemesis Neurologic: negative for visual changes, syncope, or dizziness All other systems reviewed and are otherwise negative except as noted above.    Blood pressure 132/80, pulse (!) 51, height 5' 10 (1.778 m), weight 191 lb (86.6 kg), SpO2 96%.  General appearance: alert and no distress Neck: no adenopathy, no carotid bruit, no JVD, supple, symmetrical, trachea midline, and thyroid  not enlarged, symmetric, no tenderness/mass/nodules Lungs: clear to auscultation bilaterally Heart: 3/6 apical systolic murmur consistent with mitral Lydia [place]ows. Extremities: extremities normal, atraumatic, no cyanosis or edema Pulses: 2+ and symmetric Skin: Skin color, texture, turgor normal. No rashes or lesions Neurologic: Grossly normal  EKG  EKG Interpretation Date/Time:  Tuesday January 07 2025 15:40:53 EST Ventricular Rate:  51 PR Interval:  190 QRS Duration:  88 QT  Interval:  454 QTC Calculation: 418 R Axis:   -8  Text Interpretation: Sinus bradycardia with Premature supraventricular complexes When compared with ECG of 07-Aug-2024 16:11, Premature supraventricular complexes are now Present Minimal criteria for Inferior infarct are no longer Present Confirmed by Court Carrier [phone]) on 01/07/2025 4:01:50 PM    ASSESSMENT AND PLAN:   Tobacco abuse Ongoing tobacco abuse of three-quarter pack a day recalcitrant to risk factor modification.  Symptomatic anemia Hemoglobin in the 6 range of unclear etiology status post transfusion of 2 units of packed red blood cells with repletion of iron  and B12.  Hemoglobin has stayed stable in the 11-12 range.  He is asymptomatic.  He is scheduled for TEE and EGD  tomorrow which she is cleared for.  Elevated coronary artery calcium  score Elevated coronary calcium  score of 43.8 performed 05/31/2021.  He is asymptomatic.  Moderate mitral regurgitation Severe mitral regurgitation with bileaflet prolapse on 2D echo 09/11/2024.  He had normal LV systolic function, grade 3 diastolic dysfunction with a LV diastolic dimension of 6.5 cm.  He will ultimately need TEE, right and left heart cath to further evaluate his anatomy and physiology.  This has been put on hold until the etiology of his severe iron  deficiency anemia is further elucidated.     Dorn DOROTHA Lesches MD FACP,FACC,FAHA, FSCAI 01/07/2025 4:02 PM     [1]  Current Meds  Medication Sig   acetaminophen  (TYLENOL ) 500 MG tablet Take 1,000 mg by mouth every 6 (six) hours as needed for moderate pain (pain score 4-6).   atorvastatin  (LIPITOR) 40 MG tablet Take 1 tablet (40 mg total) by mouth daily.   cyanocobalamin  1000 MCG tablet Take 1 tablet (1,000 mcg total) by mouth daily.   ferrous gluconate  (FERGON) 324 MG tablet Take 1 tablet (324 mg total) by mouth 2 (two) times daily with a meal.   ibuprofen (ADVIL) 200 MG tablet Take 400 mg by mouth every 6 (six) hours as  needed for mild pain (pain score 1-3) or moderate pain (pain score 4-6).   levothyroxine  (SYNTHROID ) 100 MCG tablet Take 1 tablet (100 mcg total) by mouth daily.   sildenafil (VIAGRA) 100 MG tablet Take 100 mg by mouth daily.   tamsulosin  (FLOMAX ) 0.4 MG CAPS capsule TAKE 1 CAPSULE BY MOUTH EVERY DAY   VITAMIN D PO Take 1 capsule by mouth daily.  [2]  Allergies Allergen Reactions   Amoxicillin Swelling    face swelled   Fish Oil Swelling    Face swelling  fish oils   Ibuprofen Swelling    Face swell   Saw Palmetto Swelling    Face swelling  saw palmetto extract   "

## 2025-01-07 NOTE — Assessment & Plan Note (Signed)
 Severe mitral regurgitation with bileaflet prolapse on 2D echo 09/11/2024.  He had normal LV systolic function, grade 3 diastolic dysfunction with a LV diastolic dimension of 6.5 cm.  He will ultimately need TEE, right and left heart cath to further evaluate his anatomy and physiology.  This has been put on hold until the etiology of his severe iron  deficiency anemia is further elucidated.

## 2025-01-07 NOTE — Assessment & Plan Note (Signed)
 Hemoglobin in the 6 range of unclear etiology status post transfusion of 2 units of packed red blood cells with repletion of iron  and B12.  Hemoglobin has stayed stable in the 11-12 range.  He is asymptomatic.  He is scheduled for TEE and EGD tomorrow which she is cleared for.

## 2025-01-07 NOTE — Assessment & Plan Note (Signed)
 Ongoing tobacco abuse of three-quarter pack a day recalcitrant to risk factor modification.

## 2025-01-07 NOTE — Assessment & Plan Note (Signed)
 Elevated coronary calcium  score of 43.8 performed 05/31/2021.  He is asymptomatic.

## 2025-01-08 NOTE — Telephone Encounter (Signed)
 Patient has been cleared for EGD on 01/15/25.  I left a detailed message for the patient.  Please see note from Dr. JINNY Lesches office note on 01/07/25.  I asked the patient to call back for any additional questions or concerns.   Symptomatic anemia Hemoglobin in the 6 range of unclear etiology status post transfusion of 2 units of packed red blood cells with repletion of iron  and B12.  Hemoglobin has stayed stable in the 11-12 range.  He is asymptomatic.  He is scheduled for TEE and EGD tomorrow which she is cleared for.

## 2025-01-15 ENCOUNTER — Encounter: Payer: Self-pay | Admitting: Gastroenterology

## 2025-01-15 ENCOUNTER — Ambulatory Visit: Admitting: Gastroenterology

## 2025-01-15 VITALS — BP 138/70 | HR 59 | Temp 97.2°F | Resp 13 | Ht 70.0 in | Wt 191.0 lb

## 2025-01-15 DIAGNOSIS — D124 Benign neoplasm of descending colon: Secondary | ICD-10-CM

## 2025-01-15 DIAGNOSIS — D122 Benign neoplasm of ascending colon: Secondary | ICD-10-CM

## 2025-01-15 DIAGNOSIS — K3189 Other diseases of stomach and duodenum: Secondary | ICD-10-CM

## 2025-01-15 DIAGNOSIS — K2950 Unspecified chronic gastritis without bleeding: Secondary | ICD-10-CM

## 2025-01-15 DIAGNOSIS — K5730 Diverticulosis of large intestine without perforation or abscess without bleeding: Secondary | ICD-10-CM

## 2025-01-15 DIAGNOSIS — D509 Iron deficiency anemia, unspecified: Secondary | ICD-10-CM

## 2025-01-15 DIAGNOSIS — K2270 Barrett's esophagus without dysplasia: Secondary | ICD-10-CM

## 2025-01-15 MED ORDER — SODIUM CHLORIDE 0.9 % IV SOLN: 500.0000 mL | Freq: Once | INTRAVENOUS | Status: DC

## 2025-01-15 NOTE — Patient Instructions (Addendum)
 Resume previous diet and medications. Awaiting pathology results. Repeat Colonoscopy date to be determined based on pathology results. No obvious cause of IDA noted on today's exam.  YOU HAD AN ENDOSCOPIC PROCEDURE TODAY AT THE [facility]:   Refer to the procedure report that was given to you for any specific questions about what was found during the examination.  If the procedure report does not answer your questions, please call your gastroenterologist to clarify.  If you requested that your care partner not be given the details of your procedure findings, then the procedure report has been included in a sealed envelope for you to review at your convenience later.  YOU SHOULD EXPECT: Some feelings of bloating in the abdomen. Passage of more gas than usual.  Walking can help get rid of the air that was put into your GI tract during the procedure and reduce the bloating. If you had a lower endoscopy (such as a colonoscopy or flexible sigmoidoscopy) you may notice spotting of blood in your stool or on the toilet paper. If you underwent a bowel prep for your procedure, you may not have a normal bowel movement for a few days.  Please Note:  You might notice some irritation and congestion in your nose or some drainage.  This is from the oxygen used during your procedure.  There is no need for concern and it should clear up in a day or so.  SYMPTOMS TO REPORT IMMEDIATELY:  Following lower endoscopy (colonoscopy or flexible sigmoidoscopy):  Excessive amounts of blood in the stool  Significant tenderness or worsening of abdominal pains  Swelling of the abdomen that is new, acute  Fever of 100F or higher  Following upper endoscopy (EGD)  Vomiting of blood or coffee ground material  New chest pain or pain under the shoulder blades  Painful or persistently difficult swallowing  New shortness of breath  Fever of 100F or higher  Black, tarry-looking stools  For urgent or emergent issues,  a gastroenterologist can be reached at any hour by calling [phone]. Do not use MyChart messaging for urgent concerns.    DIET:  We do recommend a small meal at first, but then you may proceed to your regular diet.  Drink plenty of fluids but you should avoid alcoholic beverages for 24 hours.  ACTIVITY:  You should plan to take it easy for the rest of today and you should NOT DRIVE or use heavy machinery until tomorrow (because of the sedation medicines used during the test).    FOLLOW UP: Our staff will call the number listed on your records the next business day following your procedure.  We will call around 7:15- 8:00 am to check on you and address any questions or concerns that you may have regarding the information given to you following your procedure. If we do not reach you, we will leave a message.     If any biopsies were taken you will be contacted by phone or by letter within the next 1-3 weeks.  Please call us  at [phone] if you have not heard about the biopsies in 3 weeks.    SIGNATURES/CONFIDENTIALITY: You and/or your care partner have signed paperwork which will be entered into your electronic medical record.  These signatures attest to the fact that that the information above on your After Visit Summary has been reviewed and is understood.  Full responsibility of the confidentiality of this discharge information lies with you and/or your care-partner.

## 2025-01-15 NOTE — Progress Notes (Signed)
 Pt's states no medical or surgical changes since previsit or office visit.

## 2025-01-15 NOTE — Op Note (Signed)
 [facility] Patient Name: Terry Miranda Procedure Date: 01/15/2025 9:19 AM MRN: [id number] Endoscopist: Glendia E. Stacia , MD, [id number] Age: 74 Referring MD:  Date of Birth: 1951/03/07 Gender: Male Account #: [id number] Procedure:                Colonoscopy Indications:              Iron  deficiency anemia Medicines:                Monitored Anesthesia Care Procedure:                Pre-Anesthesia Assessment:                           - Prior to the procedure, a History and Physical                            was performed, and patient medications and                            allergies were reviewed. The patient's tolerance of                            previous anesthesia was also reviewed. The risks                            and benefits of the procedure and the sedation                            options and risks were discussed with the patient.                            All questions were answered, and informed consent                            was obtained. Prior Anticoagulants: The patient has                            taken no anticoagulant or antiplatelet agents. ASA                            Grade Assessment: II - A patient with mild systemic                            disease. After reviewing the risks and benefits,                            the patient was deemed in satisfactory condition to                            undergo the procedure.                           After obtaining informed consent, the colonoscope  was passed under direct vision. Throughout the                            procedure, the patient's blood pressure, pulse, and                            oxygen saturations were monitored continuously. The                            CF [id number] #[id number] was introduced through the anus                            and advanced to the the terminal ileum, with                            identification of the appendiceal  orifice and IC                            valve. The colonoscopy was performed without                            difficulty. The patient tolerated the procedure                            well. The quality of the bowel preparation was                            adequate. The terminal ileum, ileocecal valve,                            appendiceal orifice, and rectum were photographed.                            The bowel preparation used was SUPREP via split                            dose instruction. Scope In: 10:08:24 AM Scope Out: 10:36:38 AM Scope Withdrawal Time: 0 hours 19 minutes 40 seconds  Total Procedure Duration: 0 hours 28 minutes 14 seconds  Findings:                 The perianal and digital rectal examinations were                            normal. Pertinent negatives include normal                            sphincter tone and no palpable rectal lesions.                           Two sessile polyps were found in the ascending                            colon. The polyps were 3 to 6 mm in size. These  polyps were removed with a cold snare. Resection                            and retrieval were complete. Estimated blood loss                            was minimal.                           A 15 mm polyp was found in the descending colon.                            The polyp was semi-pedunculated. The polyp was                            removed piecemeal with a hot snare. Resection and                            retrieval were complete. Estimated blood loss was                            minimal. The mucosa 2-3 distal to the polypectomy                            site was tattooed with an injection of 0.5 mL of                            [place] ink. Estimated blood loss was minimal.                           A few small-mouthed diverticula were found in the                            sigmoid colon and descending colon.                            The exam was otherwise normal throughout the                            examined colon.                           The terminal ileum appeared normal.                           The retroflexed view of the distal rectum and anal                            verge was normal and showed no anal or rectal                            abnormalities. Complications:            No immediate complications. Estimated Blood Loss:     Estimated blood loss  was minimal. Impression:               - Two 3 to 6 mm polyps in the ascending colon,                            removed with a cold snare. Resected and retrieved.                           - One 15 mm polyp in the descending colon, removed                            with a hot snare. Resected and retrieved. Tattooed.                           - Mild diverticulosis in the sigmoid colon and in                            the descending colon.                           - The examined portion of the ileum was normal.                           - The distal rectum and anal verge are normal on                            retroflexion view. Recommendation:           - Patient has a contact number available for                            emergencies. The signs and symptoms of potential                            delayed complications were discussed with the                            patient. Return to normal activities tomorrow.                            Written discharge instructions were provided to the                            patient.                           - Resume previous diet.                           - Continue present medications.                           - Await pathology results.                           - Repeat colonoscopy (date  not yet determined) for                            surveillance based on pathology results.                           - No obvious cause of iron  deficiency anemia noted                            on today's  exam. Arnoldo Hildreth E. Stacia, MD 01/15/2025 10:52:43 AM This report has been signed electronically.

## 2025-01-15 NOTE — Progress Notes (Signed)
 Sedate, gd SR, tolerated procedure well, VSS, report to RN

## 2025-01-15 NOTE — Progress Notes (Signed)
 Called to room to assist during endoscopic procedure.  Patient ID and intended procedure confirmed with present staff. Received instructions for my participation in the procedure from the performing physician.

## 2025-01-15 NOTE — Progress Notes (Signed)
 [name] History and Physical   Primary Care Physician:  Alben Therisa MATSU, PA   Reason for Procedure:   Iron  deficiency anemia  Plan:    EGD, colonoscopy   HPI: Terry Miranda is a 74 y.o. male undergoing EGD and colonoscopy to evaluate iron  deficiency anemia.  He has no family history of colon cancer and no chronic GI symptoms and reports normal appearing stools.  He had a normal colonoscopy in 2016.  No previous EGD. Hemoglobin was 6 in May 2025, but has since normalized and been stable for months at 12.  The patient was provided an opportunity to ask questions and all were answered. The patient agreed with the plan   Past Medical History:  Diagnosis Date   Arthritis    Atypical nevus 02/11/2008   Left Lower Deltoid-Slight   Atypical nevus 08/26/2003   Upper Mid Right Back Sup-Slight to Moderate (w/s), Upper Mid Right Back Inf(Slight), Mid Back-Slight to Moderate(w/s), Mid Right Back Sup-Slight to Moderate(w/s), and Mid Right Back Inf-Slight to Moderate(w/s)   Blood transfusion without reported diagnosis    Cataract    removed bilateraly   COPD (chronic obstructive pulmonary disease) (HCC)    Heart murmur    Hyperlipidemia    Hypothyroidism    IDA (iron  deficiency anemia)     Past Surgical History:  Procedure Laterality Date   COLONOSCOPY     HERNIA REPAIR     UPPER GASTROINTESTINAL ENDOSCOPY      Prior to Admission medications  Medication Sig Start Date End Date Taking? Authorizing Provider  acetaminophen  (TYLENOL ) 500 MG tablet Take 1,000 mg by mouth every 6 (six) hours as needed for moderate pain (pain score 4-6).   Yes [provider]  atorvastatin  (LIPITOR) 40 MG tablet Take 1 tablet (40 mg total) by mouth daily. 11/19/21 01/15/25 Yes Court Dorn PARAS, MD  cyanocobalamin  1000 MCG tablet Take 1 tablet (1,000 mcg total) by mouth daily. 04/26/24  Yes Lue Elsie BROCKS, MD  ferrous gluconate  [facility]) 324 MG tablet Take 1 tablet (324 mg total) by  mouth 2 (two) times daily with a meal. 04/25/24  Yes Lue Elsie BROCKS, MD  levothyroxine  (SYNTHROID ) 100 MCG tablet Take 1 tablet (100 mcg total) by mouth daily. 07/09/24  Yes Thapa, [place], MD  tamsulosin  (FLOMAX ) 0.4 MG CAPS capsule TAKE 1 CAPSULE BY MOUTH EVERY DAY 12/19/19  Yes McKenzie, Belvie CROME, MD  VITAMIN D PO Take 1 capsule by mouth daily.   Yes [provider]  albuterol (VENTOLIN HFA) 108 (90 Base) MCG/ACT inhaler Inhale 2 puffs into the lungs every 4 (four) hours as needed for wheezing or shortness of breath. Patient not taking: No sig reported    [provider]  ibuprofen (ADVIL) 200 MG tablet Take 400 mg by mouth every 6 (six) hours as needed for mild pain (pain score 1-3) or moderate pain (pain score 4-6).    [provider]  sildenafil (VIAGRA) 100 MG tablet Take 100 mg by mouth daily. 07/01/24   [provider]    Current Outpatient Medications  Medication Sig Dispense Refill   acetaminophen  (TYLENOL ) 500 MG tablet Take 1,000 mg by mouth every 6 (six) hours as needed for moderate pain (pain score 4-6).     atorvastatin  (LIPITOR) 40 MG tablet Take 1 tablet (40 mg total) by mouth daily. 90 tablet 2   cyanocobalamin  1000 MCG tablet Take 1 tablet (1,000 mcg total) by mouth daily. 30 tablet 0   ferrous gluconate  (FERGON)  324 MG tablet Take 1 tablet (324 mg total) by mouth 2 (two) times daily with a meal. 60 tablet 0   levothyroxine  (SYNTHROID ) 100 MCG tablet Take 1 tablet (100 mcg total) by mouth daily. 90 tablet 3   tamsulosin  (FLOMAX ) 0.4 MG CAPS capsule TAKE 1 CAPSULE BY MOUTH EVERY DAY 90 capsule 3   VITAMIN D PO Take 1 capsule by mouth daily.     albuterol (VENTOLIN HFA) 108 (90 Base) MCG/ACT inhaler Inhale 2 puffs into the lungs every 4 (four) hours as needed for wheezing or shortness of breath. (Patient not taking: No sig reported)     ibuprofen (ADVIL) 200 MG tablet Take 400 mg by mouth every 6 (six) hours as needed for mild pain (pain score  1-3) or moderate pain (pain score 4-6).     sildenafil (VIAGRA) 100 MG tablet Take 100 mg by mouth daily.     Current Facility-Administered Medications  Medication Dose Route Frequency Provider Last Rate Last Admin   0.9 %  sodium chloride  infusion  500 mL Intravenous Once Stacia Glendia BRAVO, MD        Allergies as of 01/15/2025 - Review Complete 01/15/2025  Allergen Reaction Noted   Amoxicillin Swelling 09/14/2009   Fish oil Swelling 03/24/2021   Saw palmetto Swelling 03/24/2021   Ibuprofen Swelling 02/09/2015    Family History  Problem Relation Age of Onset   Thyroid  cancer Brother    Colon cancer Neg Hx    Diabetes Neg Hx    Esophageal cancer Neg Hx    Rectal cancer Neg Hx    Stomach cancer Neg Hx     Social History   Socioeconomic History   Marital status: Married    Spouse name: Not on file   Number of children: Not on file   Years of education: Not on file   Highest education level: Not on file  Occupational History   Not on file  Tobacco Use   Smoking status: Every Day    Current packs/day: 1.00    Types: Cigarettes   Smokeless tobacco: Never   Tobacco comments:    3/4 PPD  Vaping Use   Vaping status: Never Used  Substance and Sexual Activity   Alcohol use: Yes    Comment: rare   Drug use: No   Sexual activity: Not Currently  Other Topics Concern   Not on file  Social History Narrative   Not on file   Social Drivers of Health   Tobacco Use: High Risk (01/15/2025)   Patient History    Smoking Tobacco Use: Every Day    Smokeless Tobacco Use: Never    Passive Exposure: Not on file  Financial Resource Strain: Not on file  Food Insecurity: No Food Insecurity (09/25/2024)   Epic    Worried About Programme Researcher, Broadcasting/film/video in the Last Year: Never true    [employer] of Food in the Last Year: Never true  Transportation Needs: No Transportation Needs (09/25/2024)   Epic    Lack of Transportation (Medical): No    Lack of Transportation (Non-Medical): No   Physical Activity: Not on file  Stress: Not on file  Social Connections: Unknown (04/24/2024)   Social Connection and Isolation Panel    Frequency of Communication with Friends and Family: Patient declined    Frequency of Social Gatherings with Friends and Family: Patient declined    Attends Religious Services: Not on Insurance Claims Handler of Clubs or Organizations: Not on  file    Attends Club or Organization Meetings: Patient declined    Marital Status: Married  Catering Manager Violence: Not At Risk (09/25/2024)   Epic    Fear of Current or Ex-Partner: No    Emotionally Abused: No    Physically Abused: No    Sexually Abused: No  Depression (PHQ2-9): Low Risk (09/25/2024)   Depression (PHQ2-9)    PHQ-2 Score: 0  Alcohol Screen: Not on file  Housing: Low Risk (09/25/2024)   Epic    Unable to Pay for Housing in the Last Year: No    Number of Times Moved in the Last Year: 0    Homeless in the Last Year: No  Utilities: Not At Risk (09/25/2024)   Epic    Threatened with loss of utilities: No  Health Literacy: Not on file    Review of Systems:  All other review of systems negative except as mentioned in the HPI.  Physical Exam: Vital signs BP (!) 142/67   Pulse (!) 48   Temp (!) 97.2 F (36.2 C) (Temporal)   Resp 13   Ht 5' 10 (1.778 m)   Wt 191 lb (86.6 kg)   SpO2 100%   BMI 27.41 kg/m   General:   Alert,  Well-developed, well-nourished, pleasant and cooperative in NAD Airway:  Mallampati 2 Lungs:  Clear throughout to auscultation.   Heart:  Regular rate and rhythm; 4/6 systolic murmur with ectopy Abdomen:  Soft, nontender and nondistended. Normal bowel sounds.   Neuro/Psych:  Normal mood and affect. A and O x 3   Devann Cribb E. Stacia, MD [facility]

## 2025-01-15 NOTE — Op Note (Signed)
 [facility] Patient Name: Terry Miranda Procedure Date: 01/15/2025 9:21 AM MRN: [id number] Endoscopist: Glendia E. Stacia , MD, [id number] Age: 74 Referring MD:  Date of Birth: [birth year] Gender: Male Account #: [id number] Procedure:                Upper GI endoscopy Indications:              Iron  deficiency anemia Medicines:                Monitored Anesthesia Care Procedure:                Pre-Anesthesia Assessment:                           - Prior to the procedure, a History and Physical                            was performed, and patient medications and                            allergies were reviewed. The patient's tolerance of                            previous anesthesia was also reviewed. The risks                            and benefits of the procedure and the sedation                            options and risks were discussed with the patient.                            All questions were answered, and informed consent                            was obtained. Prior Anticoagulants: The patient has                            taken no anticoagulant or antiplatelet agents. ASA                            Grade Assessment: II - A patient with mild systemic                            disease. After reviewing the risks and benefits,                            the patient was deemed in satisfactory condition to                            undergo the procedure.                           After obtaining informed consent, the endoscope was  passed under direct vision. Throughout the                            procedure, the patient's blood pressure, pulse, and                            oxygen saturations were monitored continuously. The                            GIF [id number] #[id number] was introduced through the                            mouth, and advanced to the second part of duodenum.                            The upper GI endoscopy was  accomplished without                            difficulty. The patient tolerated the procedure                            well. Scope In: Scope Out: Findings:                 The examined portions of the nasopharynx,                            oropharynx and larynx were normal.                           There were esophageal mucosal changes suspicious                            for multiple tongues of short-segment Barrett's                            esophagus, classified as Barrett's stage C0-M3 per                            [place] criteria present in the lower third of the                            esophagus. The maximum longitudinal extent of these                            mucosal changes was 3 cm in length. Mucosa was                            biopsied with a cold forceps for histology at                            intervals of 1 cm from 41 to 43 cm from the  incisors. A total of 3 specimen bottles were sent                            to pathology. Estimated blood loss was minimal.                           The exam of the esophagus was otherwise normal.                           Patchy mild mucosal changes characterized by                            erythema were found in the prepyloric region of the                            stomach. Biopsies were taken with a cold forceps                            for Helicobacter pylori testing. Estimated blood                            loss was minimal.                           The exam of the stomach was otherwise normal.                           The examined duodenum was normal. Biopsies for                            histology were taken with a cold forceps for                            evaluation of celiac disease. Estimated blood loss                            was minimal. Complications:            No immediate complications. Estimated Blood Loss:     Estimated blood loss was minimal. Impression:                - The examined portions of the nasopharynx,                            oropharynx and larynx were normal.                           - Esophageal mucosal changes suspicious for                            short-segment Barrett's esophagus. Biopsied.                           - Linear erythematous mucosa in the prepyloric  region of the stomach. Biopsied. These changes                            could represent either mild gastritis or very mild                            gastric antral vascular ectasia.                           - Normal examined duodenum. Biopsied.                           - No obvious sources of chronic GI bleeding to                            explain iron  deficiency anemia. Recommendation:           - Patient has a contact number available for                            emergencies. The signs and symptoms of potential                            delayed complications were discussed with the                            patient. Return to normal activities tomorrow.                            Written discharge instructions were provided to the                            patient.                           - Resume previous diet.                           - Continue present medications.                           - Await pathology results.                           - Repeat upper endoscopy (date not yet determined)                            for surveillance based on pathology results. Lynisha Osuch E. Stacia, MD 01/15/2025 10:46:41 AM This report has been signed electronically.

## 2025-01-16 ENCOUNTER — Telehealth: Payer: Self-pay

## 2025-01-16 NOTE — Telephone Encounter (Signed)
 No answer, left message to call if having any issues or concerns, B.Vester Titsworth RN

## 2025-01-17 LAB — SURGICAL PATHOLOGY

## 2025-02-12 ENCOUNTER — Ambulatory Visit: Admitting: Physician Assistant

## 2025-02-18 ENCOUNTER — Ambulatory Visit: Admitting: Cardiovascular Disease

## 2025-07-03 ENCOUNTER — Other Ambulatory Visit

## 2025-07-09 ENCOUNTER — Ambulatory Visit: Admitting: Endocrinology
